# Patient Record
Sex: Male | Born: 1960 | Race: Black or African American | Hispanic: No | Marital: Single | Smoking: Current some day smoker
Health system: Southern US, Community
[De-identification: ages and names within clinical notes are randomized; demographics above are authoritative.]

## PROBLEM LIST (undated history)

## (undated) DIAGNOSIS — N401 Enlarged prostate with lower urinary tract symptoms: Secondary | ICD-10-CM

## (undated) DIAGNOSIS — I5020 Unspecified systolic (congestive) heart failure: Secondary | ICD-10-CM

## (undated) DIAGNOSIS — R600 Localized edema: Secondary | ICD-10-CM

## (undated) DIAGNOSIS — G4733 Obstructive sleep apnea (adult) (pediatric): Secondary | ICD-10-CM

## (undated) DIAGNOSIS — I10 Essential (primary) hypertension: Secondary | ICD-10-CM

## (undated) DIAGNOSIS — N183 Chronic kidney disease, stage 3 unspecified: Secondary | ICD-10-CM

## (undated) DIAGNOSIS — E119 Type 2 diabetes mellitus without complications: Secondary | ICD-10-CM

## (undated) DIAGNOSIS — Z972 Presence of dental prosthetic device (complete) (partial): Secondary | ICD-10-CM

## (undated) DIAGNOSIS — G4730 Sleep apnea, unspecified: Secondary | ICD-10-CM

## (undated) DIAGNOSIS — Z973 Presence of spectacles and contact lenses: Secondary | ICD-10-CM

## (undated) HISTORY — DX: Unspecified systolic (congestive) heart failure: I50.20

## (undated) HISTORY — PX: UMBILICAL HERNIA REPAIR: SHX196

## (undated) HISTORY — PX: HERNIA REPAIR: SHX51

## (undated) HISTORY — DX: Essential (primary) hypertension: I10

## (undated) HISTORY — DX: Morbid (severe) obesity due to excess calories: E66.01

## (undated) HISTORY — DX: Type 2 diabetes mellitus without complications: E11.9

---

## 2004-02-01 DIAGNOSIS — R600 Localized edema: Secondary | ICD-10-CM | POA: Insufficient documentation

## 2004-02-01 DIAGNOSIS — E669 Obesity, unspecified: Secondary | ICD-10-CM | POA: Insufficient documentation

## 2004-02-01 DIAGNOSIS — Z720 Tobacco use: Secondary | ICD-10-CM | POA: Insufficient documentation

## 2004-02-01 DIAGNOSIS — G4730 Sleep apnea, unspecified: Secondary | ICD-10-CM | POA: Insufficient documentation

## 2004-02-01 DIAGNOSIS — I10 Essential (primary) hypertension: Secondary | ICD-10-CM | POA: Insufficient documentation

## 2004-02-02 DIAGNOSIS — E789 Disorder of lipoprotein metabolism, unspecified: Secondary | ICD-10-CM | POA: Insufficient documentation

## 2004-02-02 DIAGNOSIS — R739 Hyperglycemia, unspecified: Secondary | ICD-10-CM | POA: Insufficient documentation

## 2005-02-20 DIAGNOSIS — R079 Chest pain, unspecified: Secondary | ICD-10-CM | POA: Insufficient documentation

## 2007-05-18 DIAGNOSIS — Z125 Encounter for screening for malignant neoplasm of prostate: Secondary | ICD-10-CM | POA: Insufficient documentation

## 2007-12-30 DIAGNOSIS — G5600 Carpal tunnel syndrome, unspecified upper limb: Secondary | ICD-10-CM | POA: Insufficient documentation

## 2008-10-03 DIAGNOSIS — Z91199 Patient's noncompliance with other medical treatment and regimen due to unspecified reason: Secondary | ICD-10-CM | POA: Insufficient documentation

## 2012-11-30 DIAGNOSIS — K429 Umbilical hernia without obstruction or gangrene: Secondary | ICD-10-CM | POA: Insufficient documentation

## 2012-12-22 DIAGNOSIS — R9431 Abnormal electrocardiogram [ECG] [EKG]: Secondary | ICD-10-CM | POA: Insufficient documentation

## 2019-06-09 ENCOUNTER — Encounter: Payer: Self-pay | Admitting: Emergency Medicine

## 2019-06-09 ENCOUNTER — Ambulatory Visit
Admission: EM | Admit: 2019-06-09 | Discharge: 2019-06-09 | Disposition: A | Discharge status: Home / Self Care | Payer: Self-pay | Attending: Physician Assistant | Admitting: Physician Assistant

## 2019-06-09 ENCOUNTER — Other Ambulatory Visit: Payer: Self-pay

## 2019-06-09 DIAGNOSIS — J3489 Other specified disorders of nose and nasal sinuses: Secondary | ICD-10-CM

## 2019-06-09 DIAGNOSIS — R05 Cough: Secondary | ICD-10-CM

## 2019-06-09 DIAGNOSIS — R059 Cough, unspecified: Secondary | ICD-10-CM

## 2019-06-09 MED ORDER — IPRATROPIUM BROMIDE 0.06 % NA SOLN: 2.0000 | Freq: Four times a day (QID) | NASAL | 0 refills | Status: DC

## 2019-06-09 MED ORDER — PREDNISONE 50 MG PO TABS: 50.0000 mg | ORAL_TABLET | Freq: Every day | ORAL | 0 refills | Status: DC

## 2019-06-09 NOTE — Discharge Instructions (Signed)
Prednisone for cough and sinus pressure. Start atrovent nasal spray for nasal congestion/drainage. You can use over the counter nasal saline rinse such as neti pot for nasal congestion. Keep hydrated, your urine should be clear to pale yellow in color. Tylenol/motrin for fever and pain. Continue to quarantine until symptoms improve. Monitor for any worsening of symptoms, chest pain, shortness of breath, wheezing, swelling of the throat, go to the emergency department for further evaluation needed.

## 2019-06-09 NOTE — ED Provider Notes (Signed)
[name]    CSN: [id number] Arrival date & time: 06/09/19  1906      History   Chief Complaint Chief Complaint  Patient presents with  . URI    HPI Jason Mccarty is a 59 y.o. male.   59 year old male comes in for 5 day of URI symptoms. Has had cough, chills, night sweats, headache, abdominal pain, rhinorrhea, nasal congestion, sore throat. No known fevers. Abdominal pain only with coughing. Denies pain without coughing. Denies nausea, vomiting. Had diarrhea that resolved. Denies shortness of breath. Changes in taste/smell. Negative COVID test 3 days ago. Have not been taking anything for the symptoms. Increasing fluid intake. Current every day smoker, 0.25ppd, 35+ year history.      History reviewed. No pertinent past medical history.  There are no problems to display for this patient.   Past Surgical History:  Procedure Laterality Date  . HERNIA REPAIR         Home Medications    Prior to Admission medications   Medication Sig Start Date End Date Taking? Authorizing Provider  ipratropium (ATROVENT) 0.06 % nasal spray Place 2 sprays into both nostrils 4 (four) times daily. 06/09/19   Ok Edwards, PA-C  predniSONE (DELTASONE) 50 MG tablet Take 1 tablet (50 mg total) by mouth daily with breakfast. 06/09/19   Ok Edwards, PA-C    Family History Family History  Problem Relation Age of Onset  . Heart failure Father   . Hypertension Father     Social History Social History   Tobacco Use  . Smoking status: Current Every Day Smoker    Packs/day: 0.30  . Smokeless tobacco: Never Used  Substance Use Topics  . Alcohol use: Yes    Comment: very rarely  . Drug use: Never     Allergies   Benadryl [diphenhydramine]   Review of Systems Review of Systems  Reason unable to perform ROS: See HPI as above.     Physical Exam Triage Vital Signs ED Triage Vitals  Enc Vitals Group     BP 06/09/19 1930 (!) 172/104     Pulse Rate 06/09/19 1930 73   Resp 06/09/19 1930 18     Temp 06/09/19 1930 98.8 F (37.1 C)     Temp Source 06/09/19 1930 Oral     SpO2 06/09/19 1930 95 %     Weight --      Height --      Head Circumference --      Peak Flow --      Pain Score 06/09/19 1928 0     Pain Loc --      Pain Edu? --      Excl. in [place]? --    No data found.  Updated Vital Signs BP (!) 172/104 (BP Location: Right Arm)   Pulse 73   Temp 98.8 F (37.1 C) (Oral)   Resp 18   SpO2 95%   Physical Exam Constitutional:      General: He is not in acute distress.    Appearance: Normal appearance. He is not ill-appearing, toxic-appearing or diaphoretic.  HENT:     Head: Normocephalic and atraumatic.     Right Ear: Tympanic membrane, ear canal and external ear normal. Tympanic membrane is not erythematous or bulging.     Left Ear: Tympanic membrane, ear canal and external ear normal. Tympanic membrane is not erythematous or bulging.     Nose: Congestion present.     Right Sinus:  Maxillary sinus tenderness and frontal sinus tenderness present.     Left Sinus: Maxillary sinus tenderness and frontal sinus tenderness present.     Mouth/Throat:     Mouth: Mucous membranes are moist.     Pharynx: Oropharynx is clear. Uvula midline.  Cardiovascular:     Rate and Rhythm: Normal rate and regular rhythm.     Heart sounds: Normal heart sounds. No murmur. No friction rub. No gallop.   Pulmonary:     Effort: Pulmonary effort is normal. No accessory muscle usage, prolonged expiration, respiratory distress or retractions.     Comments: Lungs clear to auscultation without adventitious lung sounds. Musculoskeletal:     Cervical back: Normal range of motion and neck supple.  Neurological:     General: No focal deficit present.     Mental Status: He is alert and oriented to person, place, and time.      UC Treatments / Results  Labs (all labs ordered are listed, but only abnormal results are displayed) Labs Reviewed - No data to  display  EKG   Radiology No results found.  Procedures Procedures (including critical care time)  Medications Ordered in UC Medications - No data to display  Initial Impression / Assessment and Plan / UC Course  I have reviewed the triage vital signs and the nursing notes.  Pertinent labs & imaging results that were available during my care of the patient were reviewed by me and considered in my medical decision making (see chart for details).    Patient with negative COVID testing 3 days ago. However, given significant symptoms, will have patient quarantine and monitor for now. Prednisone for sinus pressure/cough. atrovent for nasal congestion/drainage. Other symptomatic treatment discussed. Push fluids. Return precautions given. Patient expresses understanding and agrees to plan.  Final Clinical Impressions(s) / UC Diagnoses   Final diagnoses:  Sinus pressure  Cough   ED Prescriptions    Medication Sig Dispense Auth. Provider   predniSONE (DELTASONE) 50 MG tablet Take 1 tablet (50 mg total) by mouth daily with breakfast. 5 tablet Rollo Farquhar V, PA-C   ipratropium (ATROVENT) 0.06 % nasal spray Place 2 sprays into both nostrils 4 (four) times daily. 15 mL Ok Edwards, PA-C     PDMP not reviewed this encounter.   Ok Edwards, PA-C 06/09/19 2012

## 2019-06-09 NOTE — ED Triage Notes (Addendum)
Pt presents to [facility] for assessment of 5 days of waking up covered in sweat, slight headache, cough, belly pain, chills.  Took COVID test Sunday, negative.  Pt states felt this way before when he had the flu.

## 2020-01-28 ENCOUNTER — Ambulatory Visit
Admission: EM | Admit: 2020-01-28 | Discharge: 2020-01-28 | Disposition: A | Discharge status: Home / Self Care | Payer: Self-pay

## 2020-01-28 ENCOUNTER — Other Ambulatory Visit: Payer: Self-pay

## 2020-01-28 ENCOUNTER — Telehealth: Payer: Self-pay | Admitting: Physician Assistant

## 2020-01-28 DIAGNOSIS — Z20822 Contact with and (suspected) exposure to covid-19: Secondary | ICD-10-CM

## 2020-01-28 DIAGNOSIS — J209 Acute bronchitis, unspecified: Secondary | ICD-10-CM

## 2020-01-28 MED ORDER — PREDNISONE 50 MG PO TABS: 50.0000 mg | ORAL_TABLET | Freq: Every day | ORAL | 0 refills | Status: DC

## 2020-01-28 MED ORDER — DOXYCYCLINE HYCLATE 100 MG PO CAPS: 100.0000 mg | ORAL_CAPSULE | Freq: Two times a day (BID) | ORAL | 0 refills | Status: DC

## 2020-01-28 NOTE — Telephone Encounter (Signed)
Failed eprescribe due to system error. Written Rx provided.

## 2020-01-28 NOTE — ED Triage Notes (Signed)
Pt c/o dry cough with chest congestion x1wk.

## 2020-01-28 NOTE — ED Provider Notes (Signed)
[name]    CSN: [id number] Arrival date & time: 01/28/20  1037      History   Chief Complaint Chief Complaint  Patient presents with  . Cough    HPI Jason Mccarty is a 59 y.o. male.   59 year old male comes in for 1 week history of URI symptoms. Had rhinorrhea, nasal congestion that has since resolved. Now with persistent cough that is worsening. Has increased mucous production/chest congestion, but states having hard time coughing up. Denies fever, chills, body aches. Minimal shortness of breath, mostly after coughing. Denies DOE. Current every day smoker.     History reviewed. No pertinent past medical history.  There are no problems to display for this patient.   Past Surgical History:  Procedure Laterality Date  . HERNIA REPAIR         Home Medications    Prior to Admission medications   Medication Sig Start Date End Date Taking? Authorizing Provider  aspirin EC 81 MG tablet Take 81 mg by mouth. Once a week   Yes [provider]  doxycycline (VIBRAMYCIN) 100 MG capsule Take 1 capsule (100 mg total) by mouth 2 (two) times daily. 01/28/20   Tasia Catchings, Maris Abascal V, PA-C  predniSONE (DELTASONE) 50 MG tablet Take 1 tablet (50 mg total) by mouth daily with breakfast. 01/28/20   Ok Edwards, PA-C    Family History Family History  Problem Relation Age of Onset  . Heart failure Father   . Hypertension Father     Social History Social History   Tobacco Use  . Smoking status: Current Every Day Smoker    Packs/day: 0.30  . Smokeless tobacco: Never Used  Substance Use Topics  . Alcohol use: Not Currently    Comment: very rarely  . Drug use: Never     Allergies   Benadryl [diphenhydramine]   Review of Systems Review of Systems  Reason unable to perform ROS: See HPI as above.     Physical Exam Triage Vital Signs ED Triage Vitals  Enc Vitals Group     BP 01/28/20 1132 (!) 167/99     Pulse Rate 01/28/20 1132 (!) 57     Resp 01/28/20  1132 18     Temp 01/28/20 1132 98.4 F (36.9 C)     Temp Source 01/28/20 1132 Oral     SpO2 01/28/20 1132 96 %     Weight --      Height --      Head Circumference --      Peak Flow --      Pain Score 01/28/20 1133 0     Pain Loc --      Pain Edu? --      Excl. in [place]? --    No data found.  Updated Vital Signs BP (!) 167/99 (BP Location: Left Arm)   Pulse (!) 57   Temp 98.4 F (36.9 C) (Oral)   Resp 18   SpO2 96%   Physical Exam Constitutional:      General: He is not in acute distress.    Appearance: Normal appearance. He is not ill-appearing, toxic-appearing or diaphoretic.  HENT:     Head: Normocephalic and atraumatic.     Mouth/Throat:     Mouth: Mucous membranes are moist.     Pharynx: Oropharynx is clear. Uvula midline.  Cardiovascular:     Rate and Rhythm: Normal rate and regular rhythm.     Heart sounds: Normal heart sounds. No  murmur heard.  No friction rub. No gallop.   Pulmonary:     Effort: Pulmonary effort is normal. No accessory muscle usage, prolonged expiration, respiratory distress or retractions.     Comments: Intermittent rhonchi. No rales, wheezing. Good air movement. Musculoskeletal:     Cervical back: Normal range of motion and neck supple.  Skin:    General: Skin is warm and dry.  Neurological:     General: No focal deficit present.     Mental Status: He is alert and oriented to person, place, and time.      UC Treatments / Results  Labs (all labs ordered are listed, but only abnormal results are displayed) Labs Reviewed  NOVEL CORONAVIRUS, NAA    EKG   Radiology No results found.  Procedures Procedures (including critical care time)  Medications Ordered in UC Medications - No data to display  Initial Impression / Assessment and Plan / UC Course  I have reviewed the triage vital signs and the nursing notes.  Pertinent labs & imaging results that were available during my care of the patient were reviewed by me and  considered in my medical decision making (see chart for details).    COVID PCR test ordered. Patient to quarantine until testing results return. No alarming signs on exam. Patient without diagnosed COPD, however, given smoking history with current presentation, will cover with prednisone and doxycycline. Other symptomatic treatment discussed.  Push fluids.  Return precautions given.  Patient expresses understanding and agrees to plan.  Final Clinical Impressions(s) / UC Diagnoses   Final diagnoses:  Encounter for screening laboratory testing for COVID-19 virus  Acute bronchitis, unspecified organism    ED Prescriptions    Medication Sig Dispense Auth. Provider   predniSONE (DELTASONE) 50 MG tablet Take 1 tablet (50 mg total) by mouth daily with breakfast. 5 tablet Sekai Gitlin V, PA-C   doxycycline (VIBRAMYCIN) 100 MG capsule Take 1 capsule (100 mg total) by mouth 2 (two) times daily. 10 capsule Ok Edwards, PA-C     PDMP not reviewed this encounter.   Ok Edwards, PA-C 01/28/20 1220

## 2020-01-28 NOTE — Discharge Instructions (Signed)
COVID PCR testing ordered. I would like you to quarantine until testing results. Start prednisone and doxycycline as directed. Can take over the counter mucinex, allergy medicine for symptoms as well. Tylenol/motrin for pain and fever. Keep hydrated, urine should be clear to pale yellow in color. If experiencing shortness of breath, trouble breathing, go to the emergency department for further evaluation needed.

## 2020-01-29 LAB — SARS-COV-2, NAA 2 DAY TAT

## 2020-01-29 LAB — NOVEL CORONAVIRUS, NAA: SARS-CoV-2, NAA: DETECTED — AB

## 2020-01-30 ENCOUNTER — Telehealth: Payer: Self-pay | Admitting: Nurse Practitioner

## 2020-01-30 NOTE — Telephone Encounter (Signed)
Called to discuss with Melissa Montane about Covid symptoms and the use of casirivimab/imdevimab, a combination monoclonal antibody infusion for those with mild to moderate Covid symptoms and at a high risk of hospitalization.     Pt is qualified for this infusion at the [facility] infusion center due to co-morbid conditions (BMI >25).   Per ED notes patient may be >10day symptom onset however would need to discuss further.   Unable to reach. Message left.   Alda Lea, AGPCNP-BC

## 2020-01-30 NOTE — Telephone Encounter (Signed)
Connected with Jason Mccarty - nearly all symptoms have resolved since last ER visit. Sx started > 10 days ago now and not a candidate for monoclonal antibody therapy.   No questions for me - I wished him well.    Jason Madeira, MSN, NP-C [facility] [email or web address]

## 2020-12-08 ENCOUNTER — Encounter (HOSPITAL_COMMUNITY): Payer: Self-pay | Admitting: Emergency Medicine

## 2020-12-08 ENCOUNTER — Ambulatory Visit
Admission: EM | Admit: 2020-12-08 | Discharge: 2020-12-08 | Disposition: A | Discharge status: Home / Self Care | Payer: Self-pay

## 2020-12-08 ENCOUNTER — Other Ambulatory Visit: Payer: Self-pay

## 2020-12-08 ENCOUNTER — Emergency Department (HOSPITAL_COMMUNITY)
Admission: EM | Admit: 2020-12-08 | Discharge: 2020-12-08 | Disposition: A | Discharge status: Home / Self Care | Payer: Self-pay | Attending: Emergency Medicine | Admitting: Emergency Medicine

## 2020-12-08 DIAGNOSIS — M79604 Pain in right leg: Secondary | ICD-10-CM | POA: Insufficient documentation

## 2020-12-08 DIAGNOSIS — M79661 Pain in right lower leg: Secondary | ICD-10-CM

## 2020-12-08 DIAGNOSIS — R609 Edema, unspecified: Secondary | ICD-10-CM

## 2020-12-08 DIAGNOSIS — R2241 Localized swelling, mass and lump, right lower limb: Secondary | ICD-10-CM

## 2020-12-08 DIAGNOSIS — F17210 Nicotine dependence, cigarettes, uncomplicated: Secondary | ICD-10-CM | POA: Insufficient documentation

## 2020-12-08 DIAGNOSIS — I2510 Atherosclerotic heart disease of native coronary artery without angina pectoris: Secondary | ICD-10-CM | POA: Insufficient documentation

## 2020-12-08 LAB — CBC WITH DIFFERENTIAL/PLATELET
Abs Immature Granulocytes: 0.03 10*3/uL (ref 0.00–0.07)
Basophils Absolute: 0 10*3/uL (ref 0.0–0.1)
Basophils Relative: 1 %
Eosinophils Absolute: 0.3 10*3/uL (ref 0.0–0.5)
Eosinophils Relative: 3 %
HCT: 45.9 % (ref 39.0–52.0)
Hemoglobin: 15.5 g/dL (ref 13.0–17.0)
Immature Granulocytes: 0 %
Lymphocytes Relative: 31 %
Lymphs Abs: 2.6 10*3/uL (ref 0.7–4.0)
MCH: 30.6 pg (ref 26.0–34.0)
MCHC: 33.8 g/dL (ref 30.0–36.0)
MCV: 90.5 fL (ref 80.0–100.0)
Monocytes Absolute: 0.6 10*3/uL (ref 0.1–1.0)
Monocytes Relative: 7 %
Neutro Abs: 4.9 10*3/uL (ref 1.7–7.7)
Neutrophils Relative %: 58 %
Platelets: 209 10*3/uL (ref 150–400)
RBC: 5.07 MIL/uL (ref 4.22–5.81)
RDW: 12.4 % (ref 11.5–15.5)
WBC: 8.5 10*3/uL (ref 4.0–10.5)
nRBC: 0 % (ref 0.0–0.2)

## 2020-12-08 LAB — COMPREHENSIVE METABOLIC PANEL
ALT: 29 U/L (ref 0–44)
AST: 27 U/L (ref 15–41)
Albumin: 3.6 g/dL (ref 3.5–5.0)
Alkaline Phosphatase: 70 U/L (ref 38–126)
Anion gap: 9 (ref 5–15)
BUN: 16 mg/dL (ref 8–23)
CO2: 27 mmol/L (ref 22–32)
Calcium: 9 mg/dL (ref 8.9–10.3)
Chloride: 101 mmol/L (ref 98–111)
Creatinine, Ser: 1.46 mg/dL — ABNORMAL HIGH (ref 0.61–1.24)
GFR, Estimated: 54 mL/min — ABNORMAL LOW (ref >60)
Glucose, Bld: 132 mg/dL — ABNORMAL HIGH (ref 70–99)
Potassium: 3.7 mmol/L (ref 3.5–5.1)
Sodium: 137 mmol/L (ref 135–145)
Total Bilirubin: 0.6 mg/dL (ref 0.3–1.2)
Total Protein: 7.4 g/dL (ref 6.5–8.1)

## 2020-12-08 MED ORDER — APIXABAN (ELIQUIS) EDUCATION KIT FOR DVT/PE PATIENTS
PACK | Freq: Once | Status: DC
  Filled 2020-12-08: qty 1

## 2020-12-08 MED ORDER — APIXABAN 5 MG PO TABS
10.0000 mg | ORAL_TABLET | Freq: Once | ORAL | Status: AC
  Administered 2020-12-08: 10 mg via ORAL
  Filled 2020-12-08: qty 2

## 2020-12-08 NOTE — ED Triage Notes (Signed)
Pt c/o right foot and knee swelling onset x 1 week ago. States it is painful when he walks but otherwise not painful. Has tried a knee brace and vicks at home without relief.

## 2020-12-08 NOTE — ED Provider Notes (Signed)
[facility] EMERGENCY DEPARTMENT Provider Note   CSN: [id number] Arrival date & time: 12/08/20  1808     History Chief Complaint  Patient presents with   Leg Pain    Jason Mccarty is a 60 y.o. male.   Leg Pain Associated symptoms: no back pain and no fever    60 year old male with a past medical history of CAD presenting to the emergency department with atraumatic leg pain and swelling.  Patient states that he takes a baby aspirin.  He states that 4 to 5 days ago, he noticed pain behind his right knee.  Since that time, he has had progressively worsening right lower extremity swelling.  He denies any known trauma.  He denies any fever or redness in that extremity.  No history of similar symptoms.  Patient was seen in urgent care and then sent here for a DVT ultrasound.  Patient denies any history of PE or DVT.  He denies any chest pain or shortness of breath whatsoever.  History reviewed. No pertinent past medical history.  There are no problems to display for this patient.   Past Surgical History:  Procedure Laterality Date   HERNIA REPAIR         Family History  Problem Relation Age of Onset   Heart failure Father    Hypertension Father     Social History   Tobacco Use   Smoking status: Every Day    Packs/day: 0.30    Types: Cigarettes   Smokeless tobacco: Never  Substance Use Topics   Alcohol use: Not Currently    Comment: very rarely   Drug use: Never    Home Medications Prior to Admission medications   Medication Sig Start Date End Date Taking? Authorizing Provider  aspirin EC 81 MG tablet Take 81 mg by mouth. Once a week    [provider]  doxycycline (VIBRAMYCIN) 100 MG capsule Take 1 capsule (100 mg total) by mouth 2 (two) times daily. 01/28/20   Tasia Catchings, Amy V, PA-C  predniSONE (DELTASONE) 50 MG tablet Take 1 tablet (50 mg total) by mouth daily with breakfast. 01/28/20   Tasia Catchings, Amy V, PA-C    Allergies    Benadryl  [diphenhydramine]  Review of Systems   Review of Systems  Constitutional:  Negative for chills and fever.  HENT:  Negative for ear pain and sore throat.   Eyes:  Negative for pain and visual disturbance.  Respiratory:  Negative for cough and shortness of breath.   Cardiovascular:  Positive for leg swelling. Negative for chest pain and palpitations.  Gastrointestinal:  Negative for abdominal pain and vomiting.  Genitourinary:  Negative for dysuria and hematuria.  Musculoskeletal:  Negative for arthralgias and back pain.  Skin:  Negative for color change and rash.  Neurological:  Negative for seizures and syncope.  All other systems reviewed and are negative.  Physical Exam Updated Vital Signs BP (!) 190/95 (BP Location: Right Arm)   Pulse 68   Temp 98.3 F (36.8 C) (Oral)   Resp 16   Ht '5\' 11"'$  (1.803 m)   Wt 117.9 kg   SpO2 98%   BMI 36.26 kg/m   Physical Exam Vitals and nursing note reviewed.  Constitutional:      General: He is not in acute distress.    Appearance: He is well-developed. He is not ill-appearing or toxic-appearing.  HENT:     Head: Normocephalic and atraumatic.  Eyes:     Conjunctiva/sclera: Conjunctivae normal.  Cardiovascular:     Rate and Rhythm: Normal rate and regular rhythm.     Heart sounds: No murmur heard. Pulmonary:     Effort: Pulmonary effort is normal. No respiratory distress.     Breath sounds: Normal breath sounds.  Abdominal:     Palpations: Abdomen is soft.     Tenderness: There is no abdominal tenderness.  Musculoskeletal:     Cervical back: Neck supple.     Right lower leg: Edema present.     Left lower leg: Edema present.     Comments: Patient has trace pitting edema to the left lower extremity.  Patient has no right joint line tenderness, but does have subjective tenderness to palpation to the right popliteal fossa.  He also has tenderness to the right calf on calf squeeze.  He has 2+ pitting edema, greater on the right when  compared to the left.  2+ DP pulse bilaterally.  Skin:    General: Skin is warm and dry.  Neurological:     Mental Status: He is alert.    ED Results / Procedures / Treatments   Labs (all labs ordered are listed, but only abnormal results are displayed) Labs Reviewed  COMPREHENSIVE METABOLIC PANEL - Abnormal; Notable for the following components:      Result Value   Glucose, Bld 132 (*)    Creatinine, Ser 1.46 (*)    GFR, Estimated 54 (*)    All other components within normal limits  CBC WITH DIFFERENTIAL/PLATELET    EKG None  Radiology No results found.  Procedures Procedures   Medications Ordered in ED Medications  apixaban (ELIQUIS) tablet 10 mg (10 mg Oral Given 12/08/20 2121)    ED Course  I have reviewed the triage vital signs and the nursing notes.  Pertinent labs & imaging results that were available during my care of the patient were reviewed by me and considered in my medical decision making (see chart for details).    MDM Rules/Calculators/A&P                           60 year old male with above past medical history presenting to the emergency department with progressively worsening atraumatic right lower extremity pain and swelling.  Vital signs reviewed, mildly hypertensive but otherwise within acceptable limits.  Physical exam is notable for swelling in the right lower extremity greater compared to the left.  He also has calf tenderness on my exam.  Although he has no recent surgery, recent travel, history of PE or DVT, the pretest probability for a DVT is moderate.  Other differential includes cellulitis or ligamentous injury.  The patient denies any trauma, I do not believe that radiographs are indicated as I have no concern for fracture.  The patient has no erythema or generalized tenderness over the skin so I doubt a superficial skin infection.  Fortunately, cannot obtain an ultrasound in the emergency department at this time.  Will empirically provide a  loading dose of Eliquis now and order a DVT ultrasound to be completed here tomorrow morning.  I instructed the patient to return to [facility] at 8 AM to have the DVT study completed.  Patient is comfortable with this plan.  The patient has no chest pain, shortness of breath, tachycardia, or hypoxia, I have no concern for pulmonary embolism at this time.  Strict return precautions discussed.  Final Clinical Impression(s) / ED Diagnoses Final diagnoses:  Right leg pain  Rx / DC Orders ED Discharge Orders          Ordered    US Venous Img Lower Unilateral Right (DVT)        12/08/20 2056             Claud Kelp, MD 12/08/20 [id number]    Isla Pence, MD 12/08/20 2308

## 2020-12-08 NOTE — Discharge Instructions (Addendum)
Please go to the hospital as soon as you leave urgent care for further evaluation and management. 

## 2020-12-08 NOTE — ED Triage Notes (Signed)
Pt sent from urgent care for further evaluation of right leg pain and swelling.  Pt states very difficult to walk.

## 2020-12-08 NOTE — ED Provider Notes (Signed)
Emergency Medicine Provider Triage Evaluation Note  Jason Mccarty , a 60 y.o. male  was evaluated in triage.  Pt complains of hypertension and right leg swelling. He was seen at urgent care and sent here for consideration of DVT ultrasound.  He states that he has had pain in his right leg for about 1 week and it is swollen.  He denies any history of DVT.  He states that he has not recently had any blood work done..  Review of Systems  Positive: Right leg pain and swelling Negative: Fevers  Physical Exam  BP (!) 186/95 (BP Location: Left Arm)   Pulse 70   Temp 98.6 F (37 C)   Resp 16   Ht '5\' 11"'$  (1.803 m)   Wt 117.9 kg   SpO2 95%   BMI 36.26 kg/m  Gen:   Awake, no distress   Resp:  Normal effort  MSK:   Moves extremities without difficulty, right calf and leg is swollen when compared to left. Other:  Intact sensation to light touch to distal right leg.  Right leg is warm and well-perfused.  Medical Decision Making  Medically screening exam initiated at 7:18 PM.  Appropriate orders placed.  Hawley Luby was informed that the remainder of the evaluation will be completed by another provider, this initial triage assessment does not replace that evaluation, and the importance of remaining in the ED until their evaluation is complete.  Patient presents today for concern of DVT along with hypertension.  Unable to obtain DVT study here today as he was not taking by provider until after 7 PM. As he reports he has not had any recent labs obtained, combined with his hypertension will obtain basic labs as if he does have a DVT he will require these.  Note: Portions of this report may have been transcribed using voice recognition software. Every effort was made to ensure accuracy; however, inadvertent computerized transcription errors may be present    Ollen Gross 12/08/20 1920    Luna Fuse, MD 12/11/20 [phone]

## 2020-12-08 NOTE — ED Provider Notes (Signed)
[name]    CSN: [id number] Arrival date & time: 12/08/20  1655      History   Chief Complaint Chief Complaint  Patient presents with   right swollen foot and knee    HPI Jason Mccarty is a 60 y.o. male.   Patient presents with 1 week history of right knee pain that extends down to right foot.  Also having some swelling that is located from right knee to right foot. Calf pain is present.  Patient has numbness that is present in the right great toe.  Denies any known injury to the leg but states that he did have an injury in the past to his right knee where he fell.  Patient has been using a knee brace without relief of swelling or pain.  Denies any chest pain or shortness of breath. Pain is mainly present when patient is walking.   Patient has elevated blood pressure reading in urgent care today.  Has had a mild headache today but denies any dizziness, blurred vision, nausea, vomiting.  Patient does not currently take any prescription medications for blood pressure.    History reviewed. No pertinent past medical history.  There are no problems to display for this patient.   Past Surgical History:  Procedure Laterality Date   HERNIA REPAIR         Home Medications    Prior to Admission medications   Medication Sig Start Date End Date Taking? Authorizing Provider  aspirin EC 81 MG tablet Take 81 mg by mouth. Once a week    [provider]  doxycycline (VIBRAMYCIN) 100 MG capsule Take 1 capsule (100 mg total) by mouth 2 (two) times daily. 01/28/20   Tasia Catchings, Amy V, PA-C  predniSONE (DELTASONE) 50 MG tablet Take 1 tablet (50 mg total) by mouth daily with breakfast. 01/28/20   Ok Edwards, PA-C    Family History Family History  Problem Relation Age of Onset   Heart failure Father    Hypertension Father     Social History Social History   Tobacco Use   Smoking status: Every Day    Packs/day: 0.30    Types: Cigarettes   Smokeless tobacco: Never   Substance Use Topics   Alcohol use: Not Currently    Comment: very rarely   Drug use: Never     Allergies   Benadryl [diphenhydramine]   Review of Systems Review of Systems Per HPI  Physical Exam Triage Vital Signs ED Triage Vitals  Enc Vitals Group     BP 12/08/20 1718 (!) 202/95     Pulse Rate 12/08/20 1718 71     Resp 12/08/20 1718 18     Temp 12/08/20 1718 97.6 F (36.4 C)     Temp Source 12/08/20 1718 Oral     SpO2 12/08/20 1718 95 %     Weight --      Height --      Head Circumference --      Peak Flow --      Pain Score 12/08/20 1720 0     Pain Loc --      Pain Edu? --      Excl. in [place]? --    No data found.  Updated Vital Signs BP (!) 197/97 (BP Location: Left Arm)   Pulse 71   Temp 97.6 F (36.4 C) (Oral)   Resp 18   SpO2 95%   Visual Acuity Right Eye Distance:   Left Eye Distance:  Bilateral Distance:    Right Eye Near:   Left Eye Near:    Bilateral Near:     Physical Exam Constitutional:      Appearance: Normal appearance.  HENT:     Head: Normocephalic and atraumatic.  Eyes:     Extraocular Movements: Extraocular movements intact.     Conjunctiva/sclera: Conjunctivae normal.  Cardiovascular:     Rate and Rhythm: Normal rate and regular rhythm.     Pulses: Normal pulses.     Heart sounds: Normal heart sounds.  Pulmonary:     Effort: Pulmonary effort is normal. No respiratory distress.     Breath sounds: Normal breath sounds.  Musculoskeletal:     Right knee: Swelling present. No erythema. Tenderness present. Normal pulse.     Left knee: Normal.     Right lower leg: Tenderness present. No bony tenderness. 2+ Edema present.     Right ankle: Swelling present. Tenderness present. Normal pulse.     Right foot: Normal capillary refill. Swelling and tenderness present. Normal pulse.     Comments: Tenderness to palpation left of patella of right knee.  Tenderness to palpation to calf.  Diffuse 2+ pitting edema present to right lower  extremity.  Neurovascular intact.  Neurological:     General: No focal deficit present.     Mental Status: He is alert and oriented to person, place, and time. Mental status is at baseline.  Psychiatric:        Mood and Affect: Mood normal.        Behavior: Behavior normal.        Thought Content: Thought content normal.        Judgment: Judgment normal.     UC Treatments / Results  Labs (all labs ordered are listed, but only abnormal results are displayed) Labs Reviewed - No data to display  EKG   Radiology No results found.  Procedures Procedures (including critical care time)  Medications Ordered in UC Medications - No data to display  Initial Impression / Assessment and Plan / UC Course  I have reviewed the triage vital signs and the nursing notes.  Pertinent labs & imaging results that were available during my care of the patient were reviewed by me and considered in my medical decision making (see chart for details).     Advised patient that he would need to go to the hospital to rule out DVT of right lower leg.  Patient also having elevated blood pressure which requires further treatment at the hospital.  Patient was agreeable with plan and voiced understanding. Patient was stable at discharge. Agree with wife taking patient to the hospital.  Final Clinical Impressions(s) / UC Diagnoses   Final diagnoses:  Localized swelling of right lower leg  Right calf pain     Discharge Instructions      Please go to the hospital as soon as you leave urgent care for further evaluation and management.      ED Prescriptions   None    PDMP not reviewed this encounter.   Odis Luster, FNP 12/08/20 [phone]

## 2020-12-08 NOTE — Discharge Instructions (Addendum)
It is possible that you have a blood clot in your right leg.  We have given you 1 dose of anticoagulation that should cover you until tomorrow.  Please obtain the right lower extremity ultrasound tomorrow.  Please go to Admissions here at the main campus tomorrow at 8 AM for your ultrasound study.  If it is positive, they will tell you to come back to the emergency department to be put on longer term anticoagulation.  If it is negative, please follow-up with your primary care doctor by calling the phone number I have provided for you to make an appointment.

## 2020-12-09 ENCOUNTER — Ambulatory Visit (HOSPITAL_COMMUNITY)
Admission: RE | Admit: 2020-12-09 | Discharge: 2020-12-09 | Disposition: A | Discharge status: Home / Self Care | Payer: Self-pay | Source: Ambulatory Visit | Attending: Emergency Medicine | Admitting: Emergency Medicine

## 2020-12-09 DIAGNOSIS — R609 Edema, unspecified: Secondary | ICD-10-CM | POA: Insufficient documentation

## 2020-12-09 NOTE — Progress Notes (Signed)
VASCULAR LAB    Right lower extremity venous duplex has been performed.  See CV proc for preliminary results.   Valree Feild, RVT 12/09/2020, 9:12 AM

## 2021-04-15 DIAGNOSIS — I5020 Unspecified systolic (congestive) heart failure: Secondary | ICD-10-CM

## 2021-04-15 DIAGNOSIS — I255 Ischemic cardiomyopathy: Secondary | ICD-10-CM

## 2021-04-15 DIAGNOSIS — E119 Type 2 diabetes mellitus without complications: Secondary | ICD-10-CM

## 2021-04-15 HISTORY — DX: Type 2 diabetes mellitus without complications: E11.9

## 2021-04-15 HISTORY — DX: Ischemic cardiomyopathy: I25.5

## 2021-04-15 HISTORY — DX: Unspecified systolic (congestive) heart failure: I50.20

## 2021-05-11 ENCOUNTER — Observation Stay (HOSPITAL_COMMUNITY): Payer: 59

## 2021-05-11 ENCOUNTER — Encounter (HOSPITAL_COMMUNITY): Payer: Self-pay

## 2021-05-11 ENCOUNTER — Other Ambulatory Visit: Payer: Self-pay

## 2021-05-11 ENCOUNTER — Emergency Department (HOSPITAL_COMMUNITY): Payer: 59

## 2021-05-11 ENCOUNTER — Inpatient Hospital Stay (HOSPITAL_COMMUNITY)
Admission: EM | Admit: 2021-05-11 | Discharge: 2021-05-13 | DRG: 304 | Disposition: A | Discharge status: Home / Self Care | Payer: 59 | Attending: Internal Medicine | Admitting: Internal Medicine

## 2021-05-11 DIAGNOSIS — E1129 Type 2 diabetes mellitus with other diabetic kidney complication: Secondary | ICD-10-CM | POA: Diagnosis present

## 2021-05-11 DIAGNOSIS — N1832 Chronic kidney disease, stage 3b: Secondary | ICD-10-CM | POA: Diagnosis present

## 2021-05-11 DIAGNOSIS — I160 Hypertensive urgency: Principal | ICD-10-CM | POA: Diagnosis present

## 2021-05-11 DIAGNOSIS — I130 Hypertensive heart and chronic kidney disease with heart failure and stage 1 through stage 4 chronic kidney disease, or unspecified chronic kidney disease: Secondary | ICD-10-CM | POA: Diagnosis present

## 2021-05-11 DIAGNOSIS — J9602 Acute respiratory failure with hypercapnia: Secondary | ICD-10-CM | POA: Diagnosis present

## 2021-05-11 DIAGNOSIS — R0603 Acute respiratory distress: Secondary | ICD-10-CM

## 2021-05-11 DIAGNOSIS — R0602 Shortness of breath: Secondary | ICD-10-CM

## 2021-05-11 DIAGNOSIS — Z888 Allergy status to other drugs, medicaments and biological substances status: Secondary | ICD-10-CM

## 2021-05-11 DIAGNOSIS — I5022 Chronic systolic (congestive) heart failure: Secondary | ICD-10-CM

## 2021-05-11 DIAGNOSIS — Z20822 Contact with and (suspected) exposure to covid-19: Secondary | ICD-10-CM | POA: Diagnosis present

## 2021-05-11 DIAGNOSIS — F17210 Nicotine dependence, cigarettes, uncomplicated: Secondary | ICD-10-CM | POA: Diagnosis present

## 2021-05-11 DIAGNOSIS — N179 Acute kidney failure, unspecified: Secondary | ICD-10-CM | POA: Diagnosis present

## 2021-05-11 DIAGNOSIS — N183 Chronic kidney disease, stage 3 unspecified: Secondary | ICD-10-CM | POA: Diagnosis present

## 2021-05-11 DIAGNOSIS — E1122 Type 2 diabetes mellitus with diabetic chronic kidney disease: Secondary | ICD-10-CM | POA: Diagnosis present

## 2021-05-11 DIAGNOSIS — Z8249 Family history of ischemic heart disease and other diseases of the circulatory system: Secondary | ICD-10-CM

## 2021-05-11 DIAGNOSIS — G4733 Obstructive sleep apnea (adult) (pediatric): Secondary | ICD-10-CM | POA: Diagnosis present

## 2021-05-11 DIAGNOSIS — I509 Heart failure, unspecified: Secondary | ICD-10-CM

## 2021-05-11 DIAGNOSIS — N1831 Chronic kidney disease, stage 3a: Secondary | ICD-10-CM

## 2021-05-11 DIAGNOSIS — I10 Essential (primary) hypertension: Secondary | ICD-10-CM | POA: Diagnosis present

## 2021-05-11 DIAGNOSIS — R7401 Elevation of levels of liver transaminase levels: Secondary | ICD-10-CM | POA: Diagnosis present

## 2021-05-11 DIAGNOSIS — I5021 Acute systolic (congestive) heart failure: Secondary | ICD-10-CM | POA: Diagnosis present

## 2021-05-11 DIAGNOSIS — K7581 Nonalcoholic steatohepatitis (NASH): Secondary | ICD-10-CM | POA: Diagnosis present

## 2021-05-11 DIAGNOSIS — Z8709 Personal history of other diseases of the respiratory system: Secondary | ICD-10-CM

## 2021-05-11 DIAGNOSIS — J9601 Acute respiratory failure with hypoxia: Secondary | ICD-10-CM | POA: Diagnosis present

## 2021-05-11 DIAGNOSIS — Z6841 Body Mass Index (BMI) 40.0 and over, adult: Secondary | ICD-10-CM

## 2021-05-11 DIAGNOSIS — J810 Acute pulmonary edema: Secondary | ICD-10-CM

## 2021-05-11 HISTORY — DX: Personal history of other diseases of the respiratory system: Z87.09

## 2021-05-11 HISTORY — DX: Acute respiratory failure with hypercapnia: J96.02

## 2021-05-11 HISTORY — DX: Sleep apnea, unspecified: G47.30

## 2021-05-11 LAB — I-STAT VENOUS BLOOD GAS, ED
Acid-Base Excess: 1 mmol/L (ref 0.0–2.0)
Bicarbonate: 31.1 mmol/L — ABNORMAL HIGH (ref 20.0–28.0)
Calcium, Ion: 1.14 mmol/L — ABNORMAL LOW (ref 1.15–1.40)
HCT: 44 % (ref 39.0–52.0)
Hemoglobin: 15 g/dL (ref 13.0–17.0)
O2 Saturation: 93 %
Potassium: 4.4 mmol/L (ref 3.5–5.1)
Sodium: 140 mmol/L (ref 135–145)
TCO2: 33 mmol/L — ABNORMAL HIGH (ref 22–32)
pCO2, Ven: 71.8 mmHg (ref 44.0–60.0)
pH, Ven: 7.245 — ABNORMAL LOW (ref 7.250–7.430)
pO2, Ven: 79 mmHg — ABNORMAL HIGH (ref 32.0–45.0)

## 2021-05-11 LAB — COMPREHENSIVE METABOLIC PANEL
ALT: 87 U/L — ABNORMAL HIGH (ref 0–44)
ALT: 86 U/L — ABNORMAL HIGH (ref 0–44)
AST: 120 U/L — ABNORMAL HIGH (ref 15–41)
AST: 75 U/L — ABNORMAL HIGH (ref 15–41)
Albumin: 3.2 g/dL — ABNORMAL LOW (ref 3.5–5.0)
Albumin: 3.4 g/dL — ABNORMAL LOW (ref 3.5–5.0)
Alkaline Phosphatase: 83 U/L (ref 38–126)
Alkaline Phosphatase: 76 U/L (ref 38–126)
Anion gap: 10 (ref 5–15)
Anion gap: 8 (ref 5–15)
BUN: 18 mg/dL (ref 8–23)
BUN: 18 mg/dL (ref 8–23)
CO2: 26 mmol/L (ref 22–32)
CO2: 28 mmol/L (ref 22–32)
Calcium: 8.5 mg/dL — ABNORMAL LOW (ref 8.9–10.3)
Calcium: 8.7 mg/dL — ABNORMAL LOW (ref 8.9–10.3)
Chloride: 101 mmol/L (ref 98–111)
Chloride: 103 mmol/L (ref 98–111)
Creatinine, Ser: 1.8 mg/dL — ABNORMAL HIGH (ref 0.61–1.24)
Creatinine, Ser: 1.8 mg/dL — ABNORMAL HIGH (ref 0.61–1.24)
GFR, Estimated: 42 mL/min — ABNORMAL LOW (ref >60)
GFR, Estimated: 42 mL/min — ABNORMAL LOW (ref >60)
Glucose, Bld: 229 mg/dL — ABNORMAL HIGH (ref 70–99)
Glucose, Bld: 188 mg/dL — ABNORMAL HIGH (ref 70–99)
Potassium: 4.2 mmol/L (ref 3.5–5.1)
Potassium: 3.9 mmol/L (ref 3.5–5.1)
Sodium: 137 mmol/L (ref 135–145)
Sodium: 139 mmol/L (ref 135–145)
Total Bilirubin: 1.2 mg/dL (ref 0.3–1.2)
Total Bilirubin: 0.7 mg/dL (ref 0.3–1.2)
Total Protein: 6.7 g/dL (ref 6.5–8.1)
Total Protein: 7.1 g/dL (ref 6.5–8.1)

## 2021-05-11 LAB — ECHOCARDIOGRAM COMPLETE
AR max vel: 3.24 cm2
AV Area VTI: 3.58 cm2
AV Area mean vel: 3.18 cm2
AV Mean grad: 2 mmHg
AV Peak grad: 4.8 mmHg
Ao pk vel: 1.1 m/s
Area-P 1/2: 3.58 cm2
Calc EF: 42.1 %
Height: 71 in
S' Lateral: 4.5 cm
Single Plane A2C EF: 40.7 %
Single Plane A4C EF: 40.4 %
Weight: 4560 oz

## 2021-05-11 LAB — CBC WITH DIFFERENTIAL/PLATELET
Abs Immature Granulocytes: 0.07 10*3/uL (ref 0.00–0.07)
Basophils Absolute: 0 10*3/uL (ref 0.0–0.1)
Basophils Relative: 0 %
Eosinophils Absolute: 0.2 10*3/uL (ref 0.0–0.5)
Eosinophils Relative: 2 %
HCT: 46 % (ref 39.0–52.0)
Hemoglobin: 14.7 g/dL (ref 13.0–17.0)
Immature Granulocytes: 1 %
Lymphocytes Relative: 15 %
Lymphs Abs: 1.4 10*3/uL (ref 0.7–4.0)
MCH: 29.6 pg (ref 26.0–34.0)
MCHC: 32 g/dL (ref 30.0–36.0)
MCV: 92.7 fL (ref 80.0–100.0)
Monocytes Absolute: 0.5 10*3/uL (ref 0.1–1.0)
Monocytes Relative: 6 %
Neutro Abs: 6.9 10*3/uL (ref 1.7–7.7)
Neutrophils Relative %: 76 %
Platelets: 209 10*3/uL (ref 150–400)
RBC: 4.96 MIL/uL (ref 4.22–5.81)
RDW: 12.9 % (ref 11.5–15.5)
WBC: 9 10*3/uL (ref 4.0–10.5)
nRBC: 0 % (ref 0.0–0.2)

## 2021-05-11 LAB — CBG MONITORING, ED
Glucose-Capillary: 238 mg/dL — ABNORMAL HIGH (ref 70–99)
Glucose-Capillary: 148 mg/dL — ABNORMAL HIGH (ref 70–99)

## 2021-05-11 LAB — URINALYSIS, ROUTINE W REFLEX MICROSCOPIC
Bacteria, UA: NONE SEEN
Bilirubin Urine: NEGATIVE
Glucose, UA: NEGATIVE mg/dL
Hgb urine dipstick: NEGATIVE
Ketones, ur: NEGATIVE mg/dL
Nitrite: NEGATIVE
Protein, ur: 30 mg/dL — AB
Specific Gravity, Urine: 1.015 (ref 1.005–1.030)
pH: 5 (ref 5.0–8.0)

## 2021-05-11 LAB — BRAIN NATRIURETIC PEPTIDE: B Natriuretic Peptide: 249.8 pg/mL — ABNORMAL HIGH (ref 0.0–100.0)

## 2021-05-11 LAB — TROPONIN I (HIGH SENSITIVITY)
Troponin I (High Sensitivity): 36 ng/L — ABNORMAL HIGH (ref <18)
Troponin I (High Sensitivity): 55 ng/L — ABNORMAL HIGH (ref <18)
Troponin I (High Sensitivity): 77 ng/L — ABNORMAL HIGH (ref <18)
Troponin I (High Sensitivity): 101 ng/L (ref <18)

## 2021-05-11 LAB — HEMOGLOBIN A1C
Hgb A1c MFr Bld: 7.3 % — ABNORMAL HIGH (ref 4.8–5.6)
Mean Plasma Glucose: 162.81 mg/dL

## 2021-05-11 LAB — PROTEIN / CREATININE RATIO, URINE
Creatinine, Urine: 178.2 mg/dL
Protein Creatinine Ratio: 0.17 mg/mg{Cre} — ABNORMAL HIGH (ref 0.00–0.15)
Total Protein, Urine: 30 mg/dL

## 2021-05-11 LAB — RESP PANEL BY RT-PCR (FLU A&B, COVID) ARPGX2
Influenza A by PCR: NEGATIVE
Influenza B by PCR: NEGATIVE
SARS Coronavirus 2 by RT PCR: NEGATIVE

## 2021-05-11 LAB — HIV ANTIBODY (ROUTINE TESTING W REFLEX): HIV Screen 4th Generation wRfx: NONREACTIVE

## 2021-05-11 LAB — LIPID PANEL
Cholesterol: 144 mg/dL (ref 0–200)
HDL: 34 mg/dL — ABNORMAL LOW (ref >40)
LDL Cholesterol: 98 mg/dL (ref 0–99)
Total CHOL/HDL Ratio: 4.2 RATIO
Triglycerides: 60 mg/dL (ref <150)
VLDL: 12 mg/dL (ref 0–40)

## 2021-05-11 LAB — GLUCOSE, CAPILLARY: Glucose-Capillary: 183 mg/dL — ABNORMAL HIGH (ref 70–99)

## 2021-05-11 MED ORDER — AMLODIPINE BESYLATE 10 MG PO TABS
10.0000 mg | ORAL_TABLET | Freq: Every day | ORAL | Status: DC
  Administered 2021-05-12 – 2021-05-13 (×2): 10 mg via ORAL
  Filled 2021-05-11 (×2): qty 1

## 2021-05-11 MED ORDER — ACETAMINOPHEN 650 MG RE SUPP: 650.0000 mg | Freq: Four times a day (QID) | RECTAL | Status: DC | PRN

## 2021-05-11 MED ORDER — IRBESARTAN 300 MG PO TABS
300.0000 mg | ORAL_TABLET | Freq: Every day | ORAL | Status: DC
  Administered 2021-05-11: 300 mg via ORAL
  Filled 2021-05-11 (×2): qty 1

## 2021-05-11 MED ORDER — FUROSEMIDE 10 MG/ML IJ SOLN
60.0000 mg | Freq: Once | INTRAMUSCULAR | Status: AC
  Administered 2021-05-11: 60 mg via INTRAVENOUS
  Filled 2021-05-11: qty 6

## 2021-05-11 MED ORDER — ACETAMINOPHEN 325 MG PO TABS: 650.0000 mg | ORAL_TABLET | Freq: Four times a day (QID) | ORAL | Status: DC | PRN

## 2021-05-11 MED ORDER — NITROGLYCERIN IN D5W 200-5 MCG/ML-% IV SOLN
0.0000 ug/min | INTRAVENOUS | Status: DC
  Administered 2021-05-11: 40 ug/min via INTRAVENOUS
  Filled 2021-05-11: qty 250

## 2021-05-11 MED ORDER — INSULIN ASPART 100 UNIT/ML IJ SOLN
0.0000 [IU] | Freq: Three times a day (TID) | INTRAMUSCULAR | Status: DC
  Administered 2021-05-11: 2 [IU] via SUBCUTANEOUS
  Administered 2021-05-11: 5 [IU] via SUBCUTANEOUS
  Administered 2021-05-12: 3 [IU] via SUBCUTANEOUS
  Administered 2021-05-12: 2 [IU] via SUBCUTANEOUS
  Administered 2021-05-12: 5 [IU] via SUBCUTANEOUS
  Administered 2021-05-13: 3 [IU] via SUBCUTANEOUS

## 2021-05-11 MED ORDER — RIVAROXABAN 10 MG PO TABS
10.0000 mg | ORAL_TABLET | Freq: Every day | ORAL | Status: DC
  Administered 2021-05-11 – 2021-05-13 (×3): 10 mg via ORAL
  Filled 2021-05-11 (×3): qty 1

## 2021-05-11 NOTE — H&P (Addendum)
Date: 05/11/2021               Patient Name:  Jason Mccarty MRN: [id number]  DOB: 12-23-1960 Age / Sex: 61 y.o., male   PCP: Pcp, No         Medical Service: Internal Medicine Teaching Service         Attending Physician: Dr. Lucious Groves, DO    First Contact: Wayland Denis, MD Pager: EZ [phone]  Second Contact: Hadassah Pais, MD Pager: PB [phone]       After Hours (After 5p/  First Contact Pager: [phone]  weekends / holidays): Second Contact Pager: [phone]   SUBJECTIVE   Chief Complaint: Shortness of breath  History of Present Illness: Jason Mccarty is a 61 year old man not currently under any medical care who presents with shortness of breath.  Patient woke up from sleep with short of breath.  He was not having any shortness of breath and in his normal state of health before going to bed.  EMS noted patient's SPO2 in the 70s, patient with increased work of breathing and diaphoretic, and his SBP greater than 300.  He was treated with nitroglycerin.  Patient reports a history of OSA and HTN but stopped taking medications after losing weight.  He has regained weight but never reestablished with primary care doctor.  He denies any chest pain or history of similar episodes.   Review of Systems  Constitutional:  Negative for chills and fever.  HENT:  Negative for congestion and sinus pain.   Eyes:  Negative for blurred vision and double vision.  Respiratory:  Positive for shortness of breath. Negative for cough, sputum production and wheezing.   Cardiovascular:  Negative for chest pain, orthopnea and leg swelling.  Gastrointestinal:  Negative for abdominal pain, constipation and diarrhea.  Genitourinary:  Negative for frequency and urgency.  Musculoskeletal:  Negative for falls and myalgias.  Skin:  Negative for itching and rash.  Neurological:  Positive for dizziness (The last week a few episodes). Negative for weakness.  Endo/Heme/Allergies:  Negative for polydipsia.   Psychiatric/Behavioral:  Negative for depression and substance abuse.     Meds:  Current Meds  Medication Sig   aspirin EC 81 MG tablet Take 81 mg by mouth. Once a week   ibuprofen (ADVIL) 600 MG tablet Take 600 mg by mouth every 6 (six) hours as needed for headache or mild pain.    Past Medical History:  Diagnosis Date   Sleep apnea     Past Surgical History:  Procedure Laterality Date   HERNIA REPAIR      Social:  Lives With: His girlfriend who is his support emergency contact Occupation: Security guard Level of Function: Performs own ADLs and IADLs PCP: No PCP Substances: Has smoked greater than 40 years, average pack per day.  He has cut back to a pack of cigarettes per week recently.  Occasionally drinks alcohol. No current drug use, some when he was a young man.  Family History:  Family History  Problem Relation Age of Onset   Heart failure Father    Hypertension Father      Allergies: Allergies as of 05/11/2021 - Review Complete 05/11/2021  Allergen Reaction Noted   Benadryl [diphenhydramine] Itching 06/09/2019    OBJECTIVE:   Physical Exam: Blood pressure (!) 153/99, pulse 70, temperature 98 F (36.7 C), temperature source Axillary, resp. rate 17, height 5\' 11"  (1.803 m), weight 129.3 kg, SpO2 100 %.  Constitutional: Wearing CPAP on approach, taken  off and put on 2 L nasal cannula.  He is in no acute distress.  He is nondiaphoretic and talking in complete sentences.  Obese. HENT: normocephalic atraumatic, mucous membranes moist Eyes: conjunctiva non-erythematous, arcus senilis bilateral from 9 to 3 oclock position Neck: supple, obese cannot appreciate JVD Cardiovascular: regular rate and rhythm, no murmur, 1+ pitting edema Pulmonary/Chest: On supplemental oxygen, no wheezing, no rhonchi, rales at bases  Abdominal: soft, non-tender, non-distended MSK: normal bulk and tone Neurological: alert & oriented x 3, 5/5 strength in bilateral upper and lower  extremities Skin: warm and dry Psych: Normal mood no behavior  Labs: CBC    Component Value Date/Time   WBC 9.0 05/11/2021 0341   RBC 4.96 05/11/2021 0341   HGB 15.0 05/11/2021 0505   HCT 44.0 05/11/2021 0505   PLT 209 05/11/2021 0341   MCV 92.7 05/11/2021 0341   MCH 29.6 05/11/2021 0341   MCHC 32.0 05/11/2021 0341   RDW 12.9 05/11/2021 0341   LYMPHSABS 1.4 05/11/2021 0341   MONOABS 0.5 05/11/2021 0341   EOSABS 0.2 05/11/2021 0341   BASOSABS 0.0 05/11/2021 0341     CMP     Component Value Date/Time   NA 140 05/11/2021 0505   K 4.4 05/11/2021 0505   CL 101 05/11/2021 0341   CO2 26 05/11/2021 0341   GLUCOSE 229 (H) 05/11/2021 0341   BUN 18 05/11/2021 0341   CREATININE 1.80 (H) 05/11/2021 0341   CALCIUM 8.5 (L) 05/11/2021 0341   PROT 6.7 05/11/2021 0341   ALBUMIN 3.2 (L) 05/11/2021 0341   AST 120 (H) 05/11/2021 0341   ALT 87 (H) 05/11/2021 0341   ALKPHOS 83 05/11/2021 0341   BILITOT 1.2 05/11/2021 0341   GFRNONAA 42 (L) 05/11/2021 0341    Imaging: DG Chest Port 1 View  Result Date: 05/11/2021 CLINICAL DATA:  61 year old male with history of sudden onset of shortness of breath. EXAM: PORTABLE CHEST 1 VIEW COMPARISON:  No priors. FINDINGS: Ill-defined opacities and areas of interstitial prominence throughout the mid to lower lungs bilaterally. More dense opacity in the medial aspect of the left lung base, favored to reflect subsegmental atelectasis. Small left pleural effusion. No pneumothorax. No evidence of pulmonary edema. Heart size is normal. The patient is rotated to the left on today's exam, resulting in distortion of the mediastinal contours and reduced diagnostic sensitivity and specificity for mediastinal pathology. IMPRESSION: 1. Ill-defined opacities and areas of interstitial prominence in the mid to lower lungs bilaterally, which could be seen in the setting of acute viral infection. 2. Small left-sided pleural effusion with probable subsegmental atelectasis in  the left lower lobe, although focal area of airspace consolidation is not excluded. 1. Electronically Signed   By: Vinnie Langton M.D.   On: 05/11/2021 05:01    EKG: personally reviewed my interpretation is sinus rhythm.  Prior P wave in II and III , possible LA enlargement. LAD.    ASSESSMENT & PLAN:    Assessment & Plan by Problem: Principal Problem:   Severe hypertension   Josemaria Brining is a 61 y.o. with pertinent PMH of HTN not on current treatment who presented with shortness of breath and and severe hypertension admitted for severe asymptomatic hypertension.   #Severe hypertension Patient not currently on antihypertensives.  He was once treated but after losing weight came off of medication.  He has regained weight and not followed by PCP.  Systolic blood pressure was initially reported to be 300, greater than 200 in ED but  he improved with nitro x2.  Independent review of chest x-ray showed bilateral opacities in mid and lower lung fields which I expect represents flash pulmonary edema in setting of severe hypertension.  Do not have a baseline creatinine established to see if patient has acute kidney injury.  He has some elevation of AST and ALT.  He is alert and oriented on exam.  -Gave one dose of Irbesartan. Given possible AKI, will stop Arb.Start amlodipine tomorrow and continue to diurese. Patient can be established in our clinic for further management at discharge. Given longstanding hypertension and lower extremity edema will order echo.  Patient responded to IV Lasix 60 mg , will give another dose today.  - Echo -IV Lasix 60 mg - Start amlodipine tomorrow  #AKI or CKD Stage 3b? - Only Cr on record was 12/08/20, Cr 1.46.   - Check urine protein to creatinine  -UA - Renal Ultrasound  #Hepatocellular Injury BP AST and ALT show liver enzymes are trending down, expect this could represent some minor ischemia from  episode of acute hypoxic respiratory failure. - Will need CMP  at hospital follow up. If not improving will need further workup.   #Obesity Family history of hypertension and heart failure.  We will check patient's lipid panel and hemoglobin A1c   Diet: Normal VTE: Enoxaparin IVF: None,None Code: Full  Prior to Admission Living Arrangement: Home, living with GF Anticipated Discharge Location: Home Barriers to Discharge: Workup as described under problem #1  Dispo: Admit patient to Observation with expected length of stay less than 2 midnights.  Signed: Drema Pry, MD Internal Medicine Resident PGY-3 Pager: [phone]  05/11/2021, 9:15 AM

## 2021-05-11 NOTE — ED Notes (Signed)
Called lab to have lipid panel and A1C added to previous collection

## 2021-05-11 NOTE — ED Notes (Addendum)
Pt tolerating well. O2 decreased to 2LPM  at this time with O2 at 100% on the monitor. Pt denies sob. No acute distress. RT made aware of O2 requirement change.

## 2021-05-11 NOTE — ED Provider Notes (Signed)
[facility] EMERGENCY DEPARTMENT Provider Note   CSN: [id number] Arrival date & time: 05/11/21  5809     History  Chief Complaint  Patient presents with   Shortness of Breath    Jason Mccarty is a 61 y.o. male.  The history is provided by the patient, the EMS personnel and medical records.  Shortness of Breath Jason Mccarty Age is a 61 y.o. male who presents to the Emergency Department complaining of sob.  He presents to the ED for evaluation of abrupt onset of SOB that woke him from sleep.  He was in his routine state of health yesterday.  EMS reports respiratory distress on fire arrival with SpO2 70s, increased work of breathing, diaphoresis and SBP greater than 300.  EMS treated with 2 SL nitroglycerin, CPAP.  He has a hx/o OSA.  He has a remote hx/o HTN but not on current meds - did not need meds after losing weight.  No alcohol or drug use.  On ED arrival pt reports significant improvement in sxs.      Home Medications Prior to Admission medications   Medication Sig Start Date End Date Taking? Authorizing Provider  aspirin EC 81 MG tablet Take 81 mg by mouth. Once a week   Yes [provider]  ibuprofen (ADVIL) 600 MG tablet Take 600 mg by mouth every 6 (six) hours as needed for headache or mild pain.   Yes [provider]  doxycycline (VIBRAMYCIN) 100 MG capsule Take 1 capsule (100 mg total) by mouth 2 (two) times daily. Patient not taking: Reported on 05/11/2021 01/28/20   Ok Edwards, PA-C  predniSONE (DELTASONE) 50 MG tablet Take 1 tablet (50 mg total) by mouth daily with breakfast. Patient not taking: Reported on 05/11/2021 01/28/20   Ok Edwards, PA-C      Allergies    Benadryl [diphenhydramine]    Review of Systems   Review of Systems  Respiratory:  Positive for shortness of breath.   All other systems reviewed and are negative.  Physical Exam Updated Vital Signs BP (!) 167/98    Pulse 66    Temp 98 F (36.7 C) (Axillary)    Resp 16     Ht 5\' 11"  (1.803 m)    Wt 129.3 kg    SpO2 100%    BMI 39.75 kg/m  Physical Exam Vitals and nursing note reviewed.  Constitutional:      General: He is in acute distress.     Appearance: He is well-developed. He is diaphoretic.  HENT:     Head: Normocephalic and atraumatic.  Cardiovascular:     Rate and Rhythm: Normal rate and regular rhythm.     Heart sounds: No murmur heard. Pulmonary:     Effort: Pulmonary effort is normal. No respiratory distress.     Comments: Occasional rhonchi bilaterally Abdominal:     Palpations: Abdomen is soft.     Tenderness: There is no abdominal tenderness. There is no guarding or rebound.  Musculoskeletal:        General: No tenderness.     Comments: 1+ pitting edema to BLE  Skin:    General: Skin is warm.  Neurological:     Mental Status: He is alert and oriented to person, place, and time.  Psychiatric:        Behavior: Behavior normal.    ED Results / Procedures / Treatments   Labs (all labs ordered are listed, but only abnormal results are displayed) Labs Reviewed  COMPREHENSIVE METABOLIC PANEL - Abnormal; Notable for the following components:      Result Value   Glucose, Bld 229 (*)    Creatinine, Ser 1.80 (*)    Calcium 8.5 (*)    Albumin 3.2 (*)    AST 120 (*)    ALT 87 (*)    GFR, Estimated 42 (*)    All other components within normal limits  BRAIN NATRIURETIC PEPTIDE - Abnormal; Notable for the following components:   B Natriuretic Peptide 249.8 (*)    All other components within normal limits  I-STAT VENOUS BLOOD GAS, ED - Abnormal; Notable for the following components:   pH, Ven 7.245 (*)    pCO2, Ven 71.8 (*)    pO2, Ven 79.0 (*)    Bicarbonate 31.1 (*)    TCO2 33 (*)    Calcium, Ion 1.14 (*)    All other components within normal limits  TROPONIN I (HIGH SENSITIVITY) - Abnormal; Notable for the following components:   Troponin I (High Sensitivity) 36 (*)    All other components within normal limits  TROPONIN I (HIGH  SENSITIVITY) - Abnormal; Notable for the following components:   Troponin I (High Sensitivity) 55 (*)    All other components within normal limits  RESP PANEL BY RT-PCR (FLU A&B, COVID) ARPGX2  CBC WITH DIFFERENTIAL/PLATELET    EKG EKG Interpretation  Date/Time:  Friday May 11 2021 03:52:48 EST Ventricular Rate:  85 PR Interval:  195 QRS Duration: 106 QT Interval:  422 QTC Calculation: 502 R Axis:   -54 Text Interpretation: Sinus rhythm Left axis deviation Low voltage, extremity leads Consider anterior infarct Prolonged QT interval Confirmed by Quintella Reichert [phone]) on 05/11/2021 4:20:13 AM  Radiology DG Chest Port 1 View  Result Date: 05/11/2021 CLINICAL DATA:  61 year old male with history of sudden onset of shortness of breath. EXAM: PORTABLE CHEST 1 VIEW COMPARISON:  No priors. FINDINGS: Ill-defined opacities and areas of interstitial prominence throughout the mid to lower lungs bilaterally. More dense opacity in the medial aspect of the left lung base, favored to reflect subsegmental atelectasis. Small left pleural effusion. No pneumothorax. No evidence of pulmonary edema. Heart size is normal. The patient is rotated to the left on today's exam, resulting in distortion of the mediastinal contours and reduced diagnostic sensitivity and specificity for mediastinal pathology. IMPRESSION: 1. Ill-defined opacities and areas of interstitial prominence in the mid to lower lungs bilaterally, which could be seen in the setting of acute viral infection. 2. Small left-sided pleural effusion with probable subsegmental atelectasis in the left lower lobe, although focal area of airspace consolidation is not excluded. 1. Electronically Signed   By: Vinnie Langton M.D.   On: 05/11/2021 05:01    Procedures Procedures   CRITICAL CARE Performed by: Quintella Reichert   Total critical care time: 40 minutes  Critical care time was exclusive of separately billable procedures and treating other  patients.  Critical care was necessary to treat or prevent imminent or life-threatening deterioration.  Critical care was time spent personally by me on the following activities: development of treatment plan with patient and/or surrogate as well as nursing, discussions with consultants, evaluation of patient's response to treatment, examination of patient, obtaining history from patient or surrogate, ordering and performing treatments and interventions, ordering and review of laboratory studies, ordering and review of radiographic studies, pulse oximetry and re-evaluation of patient's condition.  Medications Ordered in ED Medications  furosemide (LASIX) injection 60 mg (60 mg Intravenous Given 05/11/21  [place])    ED Course/ Medical Decision Making/ A&P                           Medical Decision Making Amount and/or Complexity of Data Reviewed Labs: ordered. Radiology: ordered.  Risk Prescription drug management. Decision regarding hospitalization.   Patient with remote hypertension history not currently on medications due to per patient not needing them secondary to weight loss here for evaluation of acute onset shortness of breath.  Patient markedly hypertensive for EMS prior to ED arrival and he was started on CPAP.  He was improving at time of ED presentation but continued to require BiPAP for respiratory support.  He was significantly hypertensive in the emergency department but compared to prehospital numbers did not require acute intervention.  Chest x-ray with bibasilar infiltrates, personally reviewed-do not favor acute respiratory infection at this time and feel his symptoms are likely more secondary to flash pulmonary edema.  BMP with slight increase in creatinine when compared to priors.  Troponins are mildly elevated and rising.  BNP is also mildly elevated.  He was treated with Lasix for diuresis.  On reassessment patient continues to feel improved and continues to appear improved  on exam.  Discussed with patient recommendation for admission for ongoing treatment and he is in agreement with treatment plan.  Medicine consult for admission for ongoing treatment.       Final Clinical Impression(s) / ED Diagnoses Final diagnoses:  Flash pulmonary edema ([place])  Respiratory distress    Rx / DC Orders ED Discharge Orders     None         Quintella Reichert, MD 05/11/21 [phone]

## 2021-05-11 NOTE — ED Notes (Signed)
Dr.Steen at bedside. Switched pt to 4LPM [place] at this time. Pt tolerating well. Will continue to monitor.

## 2021-05-11 NOTE — Progress Notes (Signed)
°  Echocardiogram 2D Echocardiogram has been performed.  Jason Mccarty 05/11/2021, 3:46 PM

## 2021-05-11 NOTE — Progress Notes (Signed)
Pt placed on CPAP for bed. RT will cont to monitor.

## 2021-05-11 NOTE — ED Notes (Signed)
Pt states he does not take blood thinners at home. Dr.Steen messaged at this time to clarify Xarelto order. Waiting for response.

## 2021-05-11 NOTE — ED Notes (Signed)
Breakfast tray ordered 

## 2021-05-11 NOTE — ED Notes (Signed)
Provider at bedside

## 2021-05-11 NOTE — ED Triage Notes (Signed)
Patient arrives via EMS from home with complaints of a sudden onset of shortness of breath. Patient woke up and had difficulty breathing. Patients oxygen saturations were in the 70's on room air at home. Initially, he was placed on a non-rebreather mask and then placed on bipap en route. Given 2 Nitro as well.    BP was 206/150 with ems. Patient reports no chest pain. Alert and oriented x4.   Hx of sleep apnea (wears cpap at night).

## 2021-05-11 NOTE — ED Notes (Signed)
Pt is 98% on RA. O2 turned off at this time. Pt educated and instructed by RN to call staff if he becomes short of breath without oxygen. Pt is currently eating breakfast and is tolerating RA with 98% on the monitor. Will continue to monitor.

## 2021-05-11 NOTE — Progress Notes (Signed)
Patient arrived to 4E from ED. CHG completed. Vitals stable. Tele placed and CCMD notified. Patient oriented to unit. Call bell within reach. Menu provided to patient.  Jason Mccarty

## 2021-05-12 DIAGNOSIS — G4733 Obstructive sleep apnea (adult) (pediatric): Secondary | ICD-10-CM | POA: Diagnosis present

## 2021-05-12 DIAGNOSIS — Z6841 Body Mass Index (BMI) 40.0 and over, adult: Secondary | ICD-10-CM | POA: Diagnosis not present

## 2021-05-12 DIAGNOSIS — Z8249 Family history of ischemic heart disease and other diseases of the circulatory system: Secondary | ICD-10-CM | POA: Diagnosis not present

## 2021-05-12 DIAGNOSIS — E1122 Type 2 diabetes mellitus with diabetic chronic kidney disease: Secondary | ICD-10-CM | POA: Diagnosis present

## 2021-05-12 DIAGNOSIS — R0603 Acute respiratory distress: Secondary | ICD-10-CM | POA: Diagnosis present

## 2021-05-12 DIAGNOSIS — J9601 Acute respiratory failure with hypoxia: Secondary | ICD-10-CM | POA: Diagnosis present

## 2021-05-12 DIAGNOSIS — Z888 Allergy status to other drugs, medicaments and biological substances status: Secondary | ICD-10-CM | POA: Diagnosis not present

## 2021-05-12 DIAGNOSIS — I160 Hypertensive urgency: Secondary | ICD-10-CM | POA: Diagnosis present

## 2021-05-12 DIAGNOSIS — N179 Acute kidney failure, unspecified: Secondary | ICD-10-CM | POA: Diagnosis present

## 2021-05-12 DIAGNOSIS — N1832 Chronic kidney disease, stage 3b: Secondary | ICD-10-CM | POA: Diagnosis present

## 2021-05-12 DIAGNOSIS — K7581 Nonalcoholic steatohepatitis (NASH): Secondary | ICD-10-CM | POA: Diagnosis present

## 2021-05-12 DIAGNOSIS — I5021 Acute systolic (congestive) heart failure: Secondary | ICD-10-CM | POA: Diagnosis present

## 2021-05-12 DIAGNOSIS — R7401 Elevation of levels of liver transaminase levels: Secondary | ICD-10-CM | POA: Diagnosis present

## 2021-05-12 DIAGNOSIS — Z20822 Contact with and (suspected) exposure to covid-19: Secondary | ICD-10-CM | POA: Diagnosis present

## 2021-05-12 DIAGNOSIS — F17210 Nicotine dependence, cigarettes, uncomplicated: Secondary | ICD-10-CM | POA: Diagnosis present

## 2021-05-12 DIAGNOSIS — J9602 Acute respiratory failure with hypercapnia: Secondary | ICD-10-CM | POA: Diagnosis present

## 2021-05-12 DIAGNOSIS — I5020 Unspecified systolic (congestive) heart failure: Secondary | ICD-10-CM

## 2021-05-12 DIAGNOSIS — I10 Essential (primary) hypertension: Secondary | ICD-10-CM

## 2021-05-12 DIAGNOSIS — I130 Hypertensive heart and chronic kidney disease with heart failure and stage 1 through stage 4 chronic kidney disease, or unspecified chronic kidney disease: Secondary | ICD-10-CM | POA: Diagnosis present

## 2021-05-12 DIAGNOSIS — N189 Chronic kidney disease, unspecified: Secondary | ICD-10-CM

## 2021-05-12 LAB — BASIC METABOLIC PANEL
Anion gap: 10 (ref 5–15)
BUN: 22 mg/dL (ref 8–23)
CO2: 28 mmol/L (ref 22–32)
Calcium: 8.5 mg/dL — ABNORMAL LOW (ref 8.9–10.3)
Chloride: 101 mmol/L (ref 98–111)
Creatinine, Ser: 2.03 mg/dL — ABNORMAL HIGH (ref 0.61–1.24)
GFR, Estimated: 37 mL/min — ABNORMAL LOW (ref >60)
Glucose, Bld: 175 mg/dL — ABNORMAL HIGH (ref 70–99)
Potassium: 3.5 mmol/L (ref 3.5–5.1)
Sodium: 139 mmol/L (ref 135–145)

## 2021-05-12 LAB — MAGNESIUM: Magnesium: 1.6 mg/dL — ABNORMAL LOW (ref 1.7–2.4)

## 2021-05-12 LAB — GLUCOSE, CAPILLARY
Glucose-Capillary: 162 mg/dL — ABNORMAL HIGH (ref 70–99)
Glucose-Capillary: 143 mg/dL — ABNORMAL HIGH (ref 70–99)
Glucose-Capillary: 218 mg/dL — ABNORMAL HIGH (ref 70–99)
Glucose-Capillary: 191 mg/dL — ABNORMAL HIGH (ref 70–99)

## 2021-05-12 MED ORDER — CARVEDILOL 3.125 MG PO TABS
3.1250 mg | ORAL_TABLET | Freq: Two times a day (BID) | ORAL | Status: DC
  Administered 2021-05-12 – 2021-05-13 (×2): 3.125 mg via ORAL
  Filled 2021-05-12 (×2): qty 1

## 2021-05-12 MED ORDER — POTASSIUM CHLORIDE CRYS ER 20 MEQ PO TBCR
40.0000 meq | EXTENDED_RELEASE_TABLET | Freq: Once | ORAL | Status: AC
  Administered 2021-05-12: 40 meq via ORAL
  Filled 2021-05-12: qty 2

## 2021-05-12 MED ORDER — FUROSEMIDE 20 MG PO TABS
20.0000 mg | ORAL_TABLET | Freq: Every day | ORAL | Status: DC
  Administered 2021-05-13: 20 mg via ORAL
  Filled 2021-05-12: qty 1

## 2021-05-12 MED ORDER — BLOOD PRESSURE CONTROL BOOK
Freq: Once | Status: AC
  Filled 2021-05-12: qty 1

## 2021-05-12 MED ORDER — MAGNESIUM SULFATE 2 GM/50ML IV SOLN
2.0000 g | Freq: Once | INTRAVENOUS | Status: AC
  Administered 2021-05-12: 2 g via INTRAVENOUS
  Filled 2021-05-12: qty 50

## 2021-05-12 MED ORDER — LIVING WELL WITH DIABETES BOOK
Freq: Once | Status: AC
  Filled 2021-05-12: qty 1

## 2021-05-12 MED ORDER — FUROSEMIDE 10 MG/ML IJ SOLN
60.0000 mg | Freq: Once | INTRAMUSCULAR | Status: AC
  Administered 2021-05-12: 60 mg via INTRAVENOUS
  Filled 2021-05-12: qty 6

## 2021-05-12 MED ORDER — LIVING BETTER WITH HEART FAILURE BOOK: Freq: Once | Status: AC

## 2021-05-12 NOTE — Progress Notes (Signed)
Pt already on CPAP for bed. RT will cont to monitor.

## 2021-05-12 NOTE — Consult Note (Addendum)
Cardiology Consultation:   Patient ID: Jason Mccarty MRN: [id number]; DOB: May 12, 1960  Admit date: 05/11/2021 Date of Consult: 05/12/2021  PCP:  Jason Mccarty No   [facility] Providers Cardiologist:  new - Dr. Marlou Mccarty    Patient Profile:   Jason Mccarty is a 61 y.o. male with a hx of hypertension, obesity, obstructive sleep apnea and newly diagnosed DM2 who is being seen 05/12/2021 for the evaluation of abnormal echocardiogram at the request of Dr. Heber Mccarty.  History of Present Illness:   Jason Mccarty is a 60 year old male with past medical history of hypertension, obesity, OSA on CPAP and newly diagnosed DM2.  According to the patient, several years ago, he was able to lose 70 pounds with diet and was able to come off of lisinopril.  However he later gained all the weight back.  He has not restarted on the blood pressure medication since.  He was seen in the ED in August 2022 for right leg pain, systolic blood pressure at the time was 197. Left lower extremity venous Doppler obtained in August 2022 was negative.  In the past year, he has been having intermittent lower extremity edema.  He was not on any diuretic at home.  He has been compliant with CPAP machine every night.  Patient was in his usual state of health until he woke up in the morning of 05/11/2021.  He woke up from sleep and he took off his CPAP.  He went to the bathroom and on his way back he started having worsening dyspnea.  This prompted the patient to seek medical attention at [facility], ED.  On arrival, blood pressure was reportedly to be in the 200 range.  His creatinine was 1.8, previous baseline 1.46 in August 2022.  Serial troponin 36 -->55 -->77 -->101. Liver enzyme was elevated with AST 120, ALT 87.  BMP 249.8.  Hemoglobin normal.  Viral panel negative.  Chest x-ray showed ill-defined opacities in the interstitial prominence in the mid to lower lungs bilaterally.  Patient was admitted to the hospital for flash pulmonary edema in the  setting of uncontrolled hypertension.  He was given 2 doses of 60 mg IV Lasix on 1/27 and 1 additional dose of 60 mg IV Lasix in the morning of 1/28.  So far, patient put out 4 L of fluid.  He was also placed on amlodipine for blood pressure control.  Echocardiogram obtained on 05/11/2021 showed EF 40 to 45% with regional wall motion abnormality, severe LVH, trivial MR.  At this point, his shortness of breath has completely resolved.  Cardiology service is consulted for abnormal echocardiogram.   Past Medical History:  Diagnosis Date   Sleep apnea     Past Surgical History:  Procedure Laterality Date   HERNIA REPAIR       Home Medications:  Prior to Admission medications   Medication Sig Start Date End Date Taking? Authorizing Provider  aspirin EC 81 MG tablet Take 81 mg by mouth. Once a week   Yes [provider]  ibuprofen (ADVIL) 600 MG tablet Take 600 mg by mouth every 6 (six) hours as needed for headache or mild pain.   Yes [provider]  doxycycline (VIBRAMYCIN) 100 MG capsule Take 1 capsule (100 mg total) by mouth 2 (two) times daily. Patient not taking: Reported on 05/11/2021 01/28/20   Ok Edwards, PA-C  predniSONE (DELTASONE) 50 MG tablet Take 1 tablet (50 mg total) by mouth daily with breakfast. Patient not taking: Reported on 05/11/2021  01/28/20   Ok Edwards, PA-C    Inpatient Medications: Scheduled Meds:  amLODipine  10 mg Oral Daily   insulin aspart  0-15 Units Subcutaneous TID WC   rivaroxaban  10 mg Oral Daily   Continuous Infusions:  PRN Meds: acetaminophen **OR** acetaminophen  Allergies:    Allergies  Allergen Reactions   Benadryl [Diphenhydramine] Itching    Social History:   Social History   Socioeconomic History   Marital status: Single    Spouse name: Not on file   Number of children: Not on file   Years of education: Not on file   Highest education level: Not on file  Occupational History   Not on file  Tobacco Use   Smoking  status: Every Day    Packs/day: 0.30    Types: Cigarettes   Smokeless tobacco: Never  Substance and Sexual Activity   Alcohol use: Not Currently    Comment: very rarely   Drug use: Never   Sexual activity: Not on file  Other Topics Concern   Not on file  Social History Narrative   Not on file   Social Determinants of Health   Financial Resource Strain: Not on file  Food Insecurity: Not on file  Transportation Needs: Not on file  Physical Activity: Not on file  Stress: Not on file  Social Connections: Not on file  Intimate Partner Violence: Not on file    Family History:    Family History  Problem Relation Age of Onset   Heart failure Father    Hypertension Father      ROS:  Please see the history of present illness.   All other ROS reviewed and negative.     Physical Exam/Data:   Vitals:   05/11/21 2203 05/12/21 0000 05/12/21 0430 05/12/21 0813  BP:  (!) 161/87 (!) 169/96 (!) 148/93  Pulse: 65 68 64 68  Resp: 18 16 20 16   Temp:  (!) 97.5 F (36.4 C) 98 F (36.7 C) 98.1 F (36.7 C)  TempSrc:  Axillary Oral Oral  SpO2: 96% 94% 98% 97%  Weight:   (!) 136.5 kg (!) 138.6 kg  Height:        Intake/Output Summary (Last 24 hours) at 05/12/2021 1107 Last data filed at 05/12/2021 1057 Gross per 24 hour  Intake 480 ml  Output 2150 ml  Net -1670 ml   Last 3 Weights 05/12/2021 05/12/2021 05/11/2021  Weight (lbs) 305 lb 8.9 oz 300 lb 14.4 oz 306 lb 10.6 oz  Weight (kg) 138.6 kg 136.487 kg 139.1 kg     Body mass index is 49.32 kg/m.  General:  Well nourished, well developed, in no acute distress HEENT: normal Neck: no JVD Vascular: No carotid bruits; Distal pulses 2+ bilaterally Cardiac:  normal S1, S2; RRR; no murmur  Lungs:  clear to auscultation bilaterally, no wheezing, rhonchi or rales  Abd: soft, nontender, no hepatomegaly  Ext: no edema Musculoskeletal:  No deformities, BUE and BLE strength normal and equal Skin: warm and dry  Neuro:  CNs 2-12 intact,  no focal abnormalities noted Psych:  Normal affect   EKG:  The EKG was personally reviewed and demonstrates: Normal sinus rhythm, no significant ST-T wave changes. Telemetry:  Telemetry was personally reviewed and demonstrates: Normal sinus rhythm, single 4 beat run of nonsustained VT, no recurrence.  Relevant CV Studies:  Echo 05/11/2021  1. Left ventricular ejection fraction, by estimation, is 40 to 45%. The  left ventricle has mildly decreased function.  The left ventricle  demonstrates regional wall motion abnormalities (see scoring  diagram/findings for description). There is severe  concentric left ventricular hypertrophy. Left ventricular diastolic  parameters are indeterminate. Elevated left ventricular end-diastolic  pressure.   2. Right ventricular systolic function was not well visualized. The right  ventricular size is normal. Tricuspid regurgitation signal is inadequate  for assessing PA pressure.   3. The mitral valve is normal in structure. Trivial mitral valve  regurgitation. No evidence of mitral stenosis.   4. The aortic valve is normal in structure. Aortic valve regurgitation is  not visualized. No aortic stenosis is present.   5. There is borderline dilatation of the ascending aorta, measuring 37  mm.   6. The inferior vena cava is normal in size with greater than 50%  respiratory variability, suggesting right atrial pressure of 3 mmHg.   Laboratory Data:  High Sensitivity Troponin:   Recent Labs  Lab 2021-05-31 0341 05/31/2021 0620 05/31/21 0953 05-31-21 1213  TROPONINIHS 36* 55* 77* 101*     Chemistry Recent Labs  Lab 31-May-2021 0341 05/31/21 0505 05-31-2021 0953 05/12/21 0258  NA 137 140 139 139  K 4.2 4.4 3.9 3.5  CL 101  --  103 101  CO2 26  --  28 28  GLUCOSE 229*  --  188* 175*  BUN 18  --  18 22  CREATININE 1.80*  --  1.80* 2.03*  CALCIUM 8.5*  --  8.7* 8.5*  MG  --   --   --  1.6*  GFRNONAA 42*  --  42* 37*  ANIONGAP 10  --  8 10    Recent  Labs  Lab 2021-05-31 0341 May 31, 2021 0953  PROT 6.7 7.1  ALBUMIN 3.2* 3.4*  AST 120* 75*  ALT 87* 86*  ALKPHOS 83 76  BILITOT 1.2 0.7   Lipids  Recent Labs  Lab 05-31-2021 0620  CHOL 144  TRIG 60  HDL 34*  LDLCALC 98  CHOLHDL 4.2    Hematology Recent Labs  Lab 2021/05/31 0341 2021/05/31 0505  WBC 9.0  --   RBC 4.96  --   HGB 14.7 15.0  HCT 46.0 44.0  MCV 92.7  --   MCH 29.6  --   MCHC 32.0  --   RDW 12.9  --   PLT 209  --    Thyroid No results for input(s): TSH, FREET4 in the last 168 hours.  BNP Recent Labs  Lab 31-May-2021 0341  BNP 249.8*    DDimer No results for input(s): DDIMER in the last 168 hours.   Radiology/Studies:  US RENAL  Result Date: 31-May-2021 CLINICAL DATA:  Acute renal insufficiency EXAM: RENAL / URINARY TRACT ULTRASOUND COMPLETE COMPARISON:  None. FINDINGS: Right Kidney: Renal measurements: 10.8 x 6.2 x 6.7 cm = volume: 216 mL. The lower pole is not well visualized due to overlying bowel gas. Echogenicity within normal limits. No mass or hydronephrosis visualized. Left Kidney: Renal measurements: 10.9 x 6.0 x 6.0 cm = volume: 108 mL. Echogenicity within normal limits. No mass or hydronephrosis visualized. Bladder: Appears normal for degree of bladder distention although only mild distention limits evaluation. Other: None. IMPRESSION: Normal appearance of the bilateral kidneys. Electronically Signed   By: Yvonne Kendall M.D.   On: 05/31/2021 14:45   DG Chest Port 1 View  Result Date: 05/31/21 CLINICAL DATA:  61 year old male with history of sudden onset of shortness of breath. EXAM: PORTABLE CHEST 1 VIEW COMPARISON:  No priors. FINDINGS: Ill-defined opacities  and areas of interstitial prominence throughout the mid to lower lungs bilaterally. More dense opacity in the medial aspect of the left lung base, favored to reflect subsegmental atelectasis. Small left pleural effusion. No pneumothorax. No evidence of pulmonary edema. Heart size is normal. The patient  is rotated to the left on today's exam, resulting in distortion of the mediastinal contours and reduced diagnostic sensitivity and specificity for mediastinal pathology. IMPRESSION: 1. Ill-defined opacities and areas of interstitial prominence in the mid to lower lungs bilaterally, which could be seen in the setting of acute viral infection. 2. Small left-sided pleural effusion with probable subsegmental atelectasis in the left lower lobe, although focal area of airspace consolidation is not excluded. 1. Electronically Signed   By: Vinnie Langton M.D.   On: 05/11/2021 05:01   ECHOCARDIOGRAM COMPLETE  Result Date: 05/11/2021    ECHOCARDIOGRAM REPORT   Patient Name:   SALBADOR FIVEASH Date of Exam: 05/11/2021 Medical Rec #:  [id number]    Height:       71.0 in Accession #:    [id number]   Weight:       285.0 lb Date of Birth:  1960/10/27     BSA:          2.451 m Patient Age:    61 years     BP:           162/83 mmHg Patient Gender: M            HR:           75 bpm. Exam Location:  Inpatient Procedure: 2D Echo, Cardiac Doppler and Color Doppler Indications:    CHF  History:        Patient has no prior history of Echocardiogram examinations.                 Signs/Symptoms:Shortness of Breath; Risk Factors:Sleep Apnea.  Sonographer:    Clayton Lefort RDCS (AE) Referring Phys: [id number] [address]ves  Sonographer Comments: Patient is morbidly obese. Image acquisition challenging due to patient body habitus. IMPRESSIONS  1. Left ventricular ejection fraction, by estimation, is 40 to 45%. The left ventricle has mildly decreased function. The left ventricle demonstrates regional wall motion abnormalities (see scoring diagram/findings for description). There is severe concentric left ventricular hypertrophy. Left ventricular diastolic parameters are indeterminate. Elevated left ventricular end-diastolic pressure.  2. Right ventricular systolic function was not well visualized. The right ventricular size is normal. Tricuspid  regurgitation signal is inadequate for assessing PA pressure.  3. The mitral valve is normal in structure. Trivial mitral valve regurgitation. No evidence of mitral stenosis.  4. The aortic valve is normal in structure. Aortic valve regurgitation is not visualized. No aortic stenosis is present.  5. There is borderline dilatation of the ascending aorta, measuring 37 mm.  6. The inferior vena cava is normal in size with greater than 50% respiratory variability, suggesting right atrial pressure of 3 mmHg. FINDINGS  Left Ventricle: Left ventricular ejection fraction, by estimation, is 40 to 45%. The left ventricle has mildly decreased function. The left ventricle demonstrates regional wall motion abnormalities. The left ventricular internal cavity size was normal in size. There is severe concentric left ventricular hypertrophy. Left ventricular diastolic parameters are indeterminate. Elevated left ventricular end-diastolic pressure. Right Ventricle: The right ventricular size is normal. No increase in right ventricular wall thickness. Right ventricular systolic function was not well visualized. Tricuspid regurgitation signal is inadequate for assessing PA pressure. Left Atrium: Left atrial size was not  well visualized. Right Atrium: Right atrial size was not well visualized. Pericardium: There is no evidence of pericardial effusion. Mitral Valve: The mitral valve is normal in structure. Trivial mitral valve regurgitation. No evidence of mitral valve stenosis. Tricuspid Valve: The tricuspid valve is normal in structure. Tricuspid valve regurgitation is not demonstrated. No evidence of tricuspid stenosis. Aortic Valve: The aortic valve is normal in structure. Aortic valve regurgitation is not visualized. No aortic stenosis is present. Aortic valve mean gradient measures 2.0 mmHg. Aortic valve peak gradient measures 4.8 mmHg. Aortic valve area, by VTI measures 3.58 cm. Pulmonic Valve: The pulmonic valve was normal in  structure. Pulmonic valve regurgitation is not visualized. No evidence of pulmonic stenosis. Aorta: The aortic root is normal in size and structure. There is borderline dilatation of the ascending aorta, measuring 37 mm. Venous: The inferior vena cava is normal in size with greater than 50% respiratory variability, suggesting right atrial pressure of 3 mmHg. IAS/Shunts: The interatrial septum appears to be lipomatous. No atrial level shunt detected by color flow Doppler.  LEFT VENTRICLE PLAX 2D LVIDd:         5.50 cm      Diastology LVIDs:         4.50 cm      LV e' medial:    4.74 cm/s LV PW:         2.10 cm      LV E/e' medial:  15.5 LV IVS:        2.00 cm      LV e' lateral:   4.47 cm/s LVOT diam:     2.10 cm      LV E/e' lateral: 16.4 LV SV:         66 LV SV Index:   27 LVOT Area:     3.46 cm  LV Volumes (MOD) LV vol d, MOD A2C: 172.0 ml LV vol d, MOD A4C: 171.0 ml LV vol s, MOD A2C: 102.0 ml LV vol s, MOD A4C: 102.0 ml LV SV MOD A2C:     70.0 ml LV SV MOD A4C:     171.0 ml LV SV MOD BP:      74.4 ml RIGHT VENTRICLE             IVC RV Basal diam:  3.70 cm     IVC diam: 1.90 cm RV S prime:     10.50 cm/s LEFT ATRIUM             Index        RIGHT ATRIUM           Index LA diam:        3.10 cm 1.26 cm/m   RA Area:     24.00 cm LA Vol (A2C):   69.5 ml 28.35 ml/m  RA Volume:   77.50 ml  31.62 ml/m LA Vol (A4C):   56.3 ml 22.97 ml/m LA Biplane Vol: 61.6 ml 25.13 ml/m  AORTIC VALVE AV Area (Vmax):    3.24 cm AV Area (Vmean):   3.18 cm AV Area (VTI):     3.58 cm AV Vmax:           110.00 cm/s AV Vmean:          69.300 cm/s AV VTI:            0.184 m AV Peak Grad:      4.8 mmHg AV Mean Grad:      2.0 mmHg LVOT Vmax:  103.00 cm/s LVOT Vmean:        63.700 cm/s LVOT VTI:          0.190 m LVOT/AV VTI ratio: 1.03  AORTA Ao Root diam: 3.10 cm Ao Asc diam:  3.70 cm MITRAL VALVE MV Area (PHT): 3.58 cm    SHUNTS MV Decel Time: 212 msec    Systemic VTI:  0.19 m MV E velocity: 73.40 cm/s  Systemic Diam: 2.10 cm  MV A velocity: 51.60 cm/s MV E/A ratio:  1.42 Fransico Him MD Electronically signed by Fransico Him MD Signature Date/Time: 05/11/2021/4:02:54 PM    Final      Assessment and Plan:   Flash pulmonary edema  -Occurred in the setting of uncontrolled hypertension. -Received 2 doses of 60 mg IV Lasix on 1/27 and 1 more dose this morning.  Creatinine has went up from 1.8 to 2.0 this morning. -On physical exam, patient is near euvolemic level with only trace diminished breath sounds in bilateral bases.  I suspect the patient can be switched to oral Lasix at 20 mg daily from this point forth.  -repeat BMET in 5 days as outpatient  Uncontrolled hypertension: started on amlodipine, consider addition of coreg 3.125mg  BID since patient is near euvolemic at this point. Will attempt add ARB as outpatient  Abnormal echocardiogram: reviewed with Dr. Marlou Mccarty, appears to have global hypokinesis without obvious regional variation. Suspect drop in EF is more likely due to uncontrolled HTN. Recommend treat BP and place on GDMT and repeat echo in 3 month as outpatient and proceed with ischemic workup only if EF does not improve.   Elevated troponin: 36->55-->77-->101. Denies any recent chest pain, suspect demand ischemia in the setting of flash pulmonary edema  Newly diagnosed DM2: per primary team.   Acute on chronic renal insufficiency: need repeat BMET 5 days after discharge.  Obstructive sleep apnea: Compliant with CPAP therapy at home   Risk Assessment/Risk Scores:        New York Heart Association (NYHA) Functional Class NYHA Class III        For questions or updates, please contact [facility] Please consult [email or web address] for contact info under    Signed, Almyra Deforest, [place]  05/12/2021 11:07 AM  Personally seen and examined. Agree with above.  61 year old male with hypertensive urgency, shortness of breath interstitial edema noted on chest x-ray unremarkable EKG with elevated high-sensitivity  troponin of approximately 101.  Feels much better after 3 doses of 60 mg IV of Lasix.  Good diuresis.  In fact he is saying he wishes he could go home.  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  Never had any chest discomfort.  On exam no significant crackles, regular rate and rhythm protuberant abdomen.  EKG shows no significant ST changes telemetry personally reviewed shows a short 4 beat run of NSVT.  Echocardiogram demonstrated EF of 40 to 45% mildly to moderately reduced.  There is note of regional wall motion abnormality however upon personally reviewing, his echo looks more generalized hypokinetic versus regional wall motion.  Global hypokinesis.  Creatinine 1.8 on admission up to 2.03 post diuresis.  Was 1.4-1.6 previously.  Liver enzymes are also mildly elevated AST/ALT 120/87, now 75/86  LDL 98 hemoglobin A1c in the 7 range, diabetic.  Assessment and plan  Pulmonary edema likely from hypertensive urgency Acute systolic heart failure Demand ischemia Diabetes Morbid obesity  - Agree with previous diuresis.  Monitoring creatinine closely.  Slight increased tolerated. - Agree with amlodipine  and starting carvedilol.  At some point may be able to tolerate angiotensin receptor blocker however creatinine may be limiting. -He is not a candidate for Entresto because of his creatinine at this time.  Also I would avoid spironolactone as well. -Could he take SGLT2 inhibitor? -No cardiac catheterization at this time.  Repeat echocardiogram in 3 to 6 months after adequate hypertension control.  If EF remains low, could consider diagnostic angiogram at that time.  Candee Furbish, MD

## 2021-05-12 NOTE — Progress Notes (Signed)
Patient states, "I just found out today I have Diabetes." Informed patient of educational channel and order Diabetic book as well as welll as CHF and High B.Books. Patient watch CHF video and tried to watch high B.P. video. But T.V. channel stop working. Informed of all Diabetic channels to watch. He will try again in a.m.

## 2021-05-12 NOTE — Progress Notes (Addendum)
HD#0 Subjective:  Overnight Events: NAEO   Patient feeling a whole lot better. Says that when he first came into the hospital he wasn't able to breath at all. He is anxious to go home soon but understands having an evaluation of his heart is important.   Objective:  Vital signs in last 24 hours: Vitals:   05/11/21 2029 05/11/21 2203 05/12/21 0000 05/12/21 0430  BP: (!) 156/71  (!) 161/87 (!) 169/96  Pulse: 66 65 68 64  Resp: 12 18 16 20   Temp: 98.7 F (37.1 C)  (!) 97.5 F (36.4 C) 98 F (36.7 C)  TempSrc: Oral  Axillary Oral  SpO2: 95% 96% 94% 98%  Weight:    (!) 136.5 kg  Height:       Supplemental O2: Room Air SpO2: 98 % O2 Flow Rate (L/min): 2 L/min FiO2 (%): 40 %   Physical Exam:  Constitutional: Obese appearing male resting comfortably in bed, in no acute distress HENT: normocephalic atraumatic, mucous membranes moist Eyes: conjunctiva non-erythematous Neck: supple Cardiovascular: regular rate and rhythm, no m/r/g, trace pitting edema Pulmonary/Chest: normal work of breathing on room air, lungs clear to auscultation bilaterally Abdominal: soft, non-tender, distended MSK: normal bulk and tone Neurological: alert & oriented x 3 Skin: warm and dry Psych: normal affect  Filed Weights   05/11/21 0342 05/11/21 1852 05/12/21 0430  Weight: 129.3 kg (!) 139.1 kg (!) 136.5 kg     Intake/Output Summary (Last 24 hours) at 05/12/2021 0726 Last data filed at 05/11/2021 2100 Gross per 24 hour  Intake 240 ml  Output 1930 ml  Net -1690 ml   Net IO Since Admission: -2,590 mL [05/12/21 0726]  Pertinent Labs: CBC Latest Ref Rng & Units 05/11/2021 05/11/2021 12/08/2020  WBC 4.0 - 10.5 K/uL - 9.0 8.5  Hemoglobin 13.0 - 17.0 g/dL 15.0 14.7 15.5  Hematocrit 39.0 - 52.0 % 44.0 46.0 45.9  Platelets 150 - 400 K/uL - 209 209    CMP Latest Ref Rng & Units 05/12/2021 05/11/2021 05/11/2021  Glucose 70 - 99 mg/dL 175(H) 188(H) -  BUN 8 - 23 mg/dL 22 18 -  Creatinine 0.61 - 1.24  mg/dL 2.03(H) 1.80(H) -  Sodium 135 - 145 mmol/L 139 139 140  Potassium 3.5 - 5.1 mmol/L 3.5 3.9 4.4  Chloride 98 - 111 mmol/L 101 103 -  CO2 22 - 32 mmol/L 28 28 -  Calcium 8.9 - 10.3 mg/dL 8.5(L) 8.7(L) -  Total Protein 6.5 - 8.1 g/dL - 7.1 -  Total Bilirubin 0.3 - 1.2 mg/dL - 0.7 -  Alkaline Phos 38 - 126 U/L - 76 -  AST 15 - 41 U/L - 75(H) -  ALT 0 - 44 U/L - 86(H) -    Imaging: US RENAL  Result Date: 05/11/2021 CLINICAL DATA:  Acute renal insufficiency EXAM: RENAL / URINARY TRACT ULTRASOUND COMPLETE COMPARISON:  None. FINDINGS: Right Kidney: Renal measurements: 10.8 x 6.2 x 6.7 cm = volume: 216 mL. The lower pole is not well visualized due to overlying bowel gas. Echogenicity within normal limits. No mass or hydronephrosis visualized. Left Kidney: Renal measurements: 10.9 x 6.0 x 6.0 cm = volume: 108 mL. Echogenicity within normal limits. No mass or hydronephrosis visualized. Bladder: Appears normal for degree of bladder distention although only mild distention limits evaluation. Other: None. IMPRESSION: Normal appearance of the bilateral kidneys. Electronically Signed   By: Yvonne Kendall M.D.   On: 05/11/2021 14:45   ECHOCARDIOGRAM COMPLETE  Result Date:  05/11/2021    ECHOCARDIOGRAM REPORT   Patient Name:   JAHZIR STROHMEIER Date of Exam: 05/11/2021 Medical Rec #:  [id number]    Height:       71.0 in Accession #:    [id number]   Weight:       285.0 lb Date of Birth:  [birth year]     BSA:          2.451 m Patient Age:    61 years     BP:           162/83 mmHg Patient Gender: M            HR:           75 bpm. Exam Location:  Inpatient Procedure: 2D Echo, Cardiac Doppler and Color Doppler Indications:    CHF  History:        Patient has no prior history of Echocardiogram examinations.                 Signs/Symptoms:Shortness of Breath; Risk Factors:Sleep Apnea.  Sonographer:    Clayton Lefort RDCS (AE) Referring Phys: [id number] [address]  Sonographer Comments: Patient is morbidly obese. Image acquisition  challenging due to patient body habitus. IMPRESSIONS  1. Left ventricular ejection fraction, by estimation, is 40 to 45%. The left ventricle has mildly decreased function. The left ventricle demonstrates regional wall motion abnormalities (see scoring diagram/findings for description). There is severe concentric left ventricular hypertrophy. Left ventricular diastolic parameters are indeterminate. Elevated left ventricular end-diastolic pressure.  2. Right ventricular systolic function was not well visualized. The right ventricular size is normal. Tricuspid regurgitation signal is inadequate for assessing PA pressure.  3. The mitral valve is normal in structure. Trivial mitral valve regurgitation. No evidence of mitral stenosis.  4. The aortic valve is normal in structure. Aortic valve regurgitation is not visualized. No aortic stenosis is present.  5. There is borderline dilatation of the ascending aorta, measuring 37 mm.  6. The inferior vena cava is normal in size with greater than 50% respiratory variability, suggesting right atrial pressure of 3 mmHg. FINDINGS  Left Ventricle: Left ventricular ejection fraction, by estimation, is 40 to 45%. The left ventricle has mildly decreased function. The left ventricle demonstrates regional wall motion abnormalities. The left ventricular internal cavity size was normal in size. There is severe concentric left ventricular hypertrophy. Left ventricular diastolic parameters are indeterminate. Elevated left ventricular end-diastolic pressure. Right Ventricle: The right ventricular size is normal. No increase in right ventricular wall thickness. Right ventricular systolic function was not well visualized. Tricuspid regurgitation signal is inadequate for assessing PA pressure. Left Atrium: Left atrial size was not well visualized. Right Atrium: Right atrial size was not well visualized. Pericardium: There is no evidence of pericardial effusion. Mitral Valve: The mitral valve is  normal in structure. Trivial mitral valve regurgitation. No evidence of mitral valve stenosis. Tricuspid Valve: The tricuspid valve is normal in structure. Tricuspid valve regurgitation is not demonstrated. No evidence of tricuspid stenosis. Aortic Valve: The aortic valve is normal in structure. Aortic valve regurgitation is not visualized. No aortic stenosis is present. Aortic valve mean gradient measures 2.0 mmHg. Aortic valve peak gradient measures 4.8 mmHg. Aortic valve area, by VTI measures 3.58 cm. Pulmonic Valve: The pulmonic valve was normal in structure. Pulmonic valve regurgitation is not visualized. No evidence of pulmonic stenosis. Aorta: The aortic root is normal in size and structure. There is borderline dilatation of the ascending aorta, measuring 37 mm. Venous:  The inferior vena cava is normal in size with greater than 50% respiratory variability, suggesting right atrial pressure of 3 mmHg. IAS/Shunts: The interatrial septum appears to be lipomatous. No atrial level shunt detected by color flow Doppler.  LEFT VENTRICLE PLAX 2D LVIDd:         5.50 cm      Diastology LVIDs:         4.50 cm      LV e' medial:    4.74 cm/s LV PW:         2.10 cm      LV E/e' medial:  15.5 LV IVS:        2.00 cm      LV e' lateral:   4.47 cm/s LVOT diam:     2.10 cm      LV E/e' lateral: 16.4 LV SV:         66 LV SV Index:   27 LVOT Area:     3.46 cm  LV Volumes (MOD) LV vol d, MOD A2C: 172.0 ml LV vol d, MOD A4C: 171.0 ml LV vol s, MOD A2C: 102.0 ml LV vol s, MOD A4C: 102.0 ml LV SV MOD A2C:     70.0 ml LV SV MOD A4C:     171.0 ml LV SV MOD BP:      74.4 ml RIGHT VENTRICLE             IVC RV Basal diam:  3.70 cm     IVC diam: 1.90 cm RV S prime:     10.50 cm/s LEFT ATRIUM             Index        RIGHT ATRIUM           Index LA diam:        3.10 cm 1.26 cm/m   RA Area:     24.00 cm LA Vol (A2C):   69.5 ml 28.35 ml/m  RA Volume:   77.50 ml  31.62 ml/m LA Vol (A4C):   56.3 ml 22.97 ml/m LA Biplane Vol: 61.6 ml 25.13  ml/m  AORTIC VALVE AV Area (Vmax):    3.24 cm AV Area (Vmean):   3.18 cm AV Area (VTI):     3.58 cm AV Vmax:           110.00 cm/s AV Vmean:          69.300 cm/s AV VTI:            0.184 m AV Peak Grad:      4.8 mmHg AV Mean Grad:      2.0 mmHg LVOT Vmax:         103.00 cm/s LVOT Vmean:        63.700 cm/s LVOT VTI:          0.190 m LVOT/AV VTI ratio: 1.03  AORTA Ao Root diam: 3.10 cm Ao Asc diam:  3.70 cm MITRAL VALVE MV Area (PHT): 3.58 cm    SHUNTS MV Decel Time: 212 msec    Systemic VTI:  0.19 m MV E velocity: 73.40 cm/s  Systemic Diam: 2.10 cm MV A velocity: 51.60 cm/s MV E/A ratio:  1.42 Fransico Him MD Electronically signed by Fransico Him MD Signature Date/Time: 05/11/2021/4:02:54 PM    Final     Assessment/Plan:   Principal Problem:   Severe hypertension Active Problems:   Acute respiratory failure with hypercapnia (HCC)   Acute congestive heart failure (HCC)  Obstructive sleep apnea   AKI (acute kidney injury) ([place])   Type 2 diabetes mellitus with kidney complication, without long-term current use of insulin ([place])   Morbid obesity ([place])   Patient Summary: Jason Mccarty is a 61 y.o. with a pertinent PMH of HTN not on current treatment who presented with shortness of breath and and severe hypertension admitted for severe asymptomatic hypertension and new diagnosis of HFrEF   #Severe hypertension Patient previously not on antihypertensives. Blood pressure on admission above 200. Now 150-160s. Holding off on ace/arbs due to AKI below but will benefit from adjustment of therapy to GDMT once condition improves. - amlodipine 10 daily  #HFrEF Patient's echo showed an EF of 40-45% with regional wall motion abnormalities. Will consult cardiology today to see whether any interventions are required given new diagnosis. - strict I/O - cardiac monitoring - daily weights - K >4, Mg >2 - cardiology consult appreciate assistance - furosemide 60 IV daily  #AKI or CKD Stage 3b? Creatinine  slightly up today at 2.0.  - avoiding nephrotoxic medations; holding arb - daily bmp - strict I/O  #Hepatocellular Injury BP AST and ALT show liver enzymes are trending down, expect this could represent some minor ischemia from  episode of acute hypoxic respiratory failure. - cmp repeat at hospital follow up  #T2DM New diagnosis this admission. A1c 7.3 - start metformin on discharge - ssi - diabetic diet   Prior to Admission Living Arrangement: Home Anticipated Discharge Location: Home Barriers to Discharge: Ischemic eval/HF workup Dispo: Anticipated discharge to Home in 2 days  Scarlett Presto, MD Internal Medicine Resident PGY-1 Pager [phone] Please contact the on call pager after 5 pm and on weekends at [phone].

## 2021-05-12 NOTE — Progress Notes (Signed)
Inpatient Diabetes Program Recommendations  AACE/ADA: New Consensus Statement on Inpatient Glycemic Control (2015)  Target Ranges:  Prepandial:   less than 140 mg/dL      Peak postprandial:   less than 180 mg/dL (1-2 hours)      Critically ill patients:  140 - 180 mg/dL   Lab Results  Component Value Date   GLUCAP 143 (H) 05/12/2021   HGBA1C 7.3 (H) 05/11/2021    Review of Glycemic Control  Latest Reference Range & Units 05/11/21 21:31 05/12/21 06:09 05/12/21 11:28  Glucose-Capillary 70 - 99 mg/dL 183 (H) 162 (H) 143 (H)   Diabetes history: DM 2-New diagnosis Outpatient Diabetes medications:  None Current orders for Inpatient glycemic control:  Novolog moderate tid with meals  Inpatient Diabetes Program Recommendations:    Referral received regarding new diagnosis of DM.  Called patient by phone (Diabetes coordinator working remotely). Spoke with pt about new diagnosis.  Discussed A1C results with him and explained what an A1C is, basic pathophysiology of DM Type 2, basic home care, importance of checking CBGs and maintaining good CBG control to prevent long-term and short-term complications. Have ordered educational booklet.  Encouraged close follow-up with PCP- Patient states that he is planning to get new PCP.    Consider ordering glucose meter for patient to monitor at home.  Also consider starting PO agent for DM for patient (?metformin 500 mg bid).  Thanks,  Adah Perl, RN, BC-ADM Inpatient Diabetes Coordinator Pager [phone]   (8a-5p)

## 2021-05-13 DIAGNOSIS — E1122 Type 2 diabetes mellitus with diabetic chronic kidney disease: Secondary | ICD-10-CM

## 2021-05-13 DIAGNOSIS — N179 Acute kidney failure, unspecified: Secondary | ICD-10-CM

## 2021-05-13 DIAGNOSIS — I110 Hypertensive heart disease with heart failure: Secondary | ICD-10-CM

## 2021-05-13 DIAGNOSIS — I5020 Unspecified systolic (congestive) heart failure: Secondary | ICD-10-CM

## 2021-05-13 DIAGNOSIS — S36119A Unspecified injury of liver, initial encounter: Secondary | ICD-10-CM

## 2021-05-13 DIAGNOSIS — N1832 Chronic kidney disease, stage 3b: Secondary | ICD-10-CM

## 2021-05-13 DIAGNOSIS — I130 Hypertensive heart and chronic kidney disease with heart failure and stage 1 through stage 4 chronic kidney disease, or unspecified chronic kidney disease: Secondary | ICD-10-CM

## 2021-05-13 LAB — CBC
HCT: 41.9 % (ref 39.0–52.0)
Hemoglobin: 13.7 g/dL (ref 13.0–17.0)
MCH: 29.3 pg (ref 26.0–34.0)
MCHC: 32.7 g/dL (ref 30.0–36.0)
MCV: 89.7 fL (ref 80.0–100.0)
Platelets: 220 10*3/uL (ref 150–400)
RBC: 4.67 MIL/uL (ref 4.22–5.81)
RDW: 13 % (ref 11.5–15.5)
WBC: 9 10*3/uL (ref 4.0–10.5)
nRBC: 0 % (ref 0.0–0.2)

## 2021-05-13 LAB — BASIC METABOLIC PANEL
Anion gap: 11 (ref 5–15)
BUN: 24 mg/dL — ABNORMAL HIGH (ref 8–23)
CO2: 27 mmol/L (ref 22–32)
Calcium: 8.4 mg/dL — ABNORMAL LOW (ref 8.9–10.3)
Chloride: 99 mmol/L (ref 98–111)
Creatinine, Ser: 1.9 mg/dL — ABNORMAL HIGH (ref 0.61–1.24)
GFR, Estimated: 40 mL/min — ABNORMAL LOW (ref >60)
Glucose, Bld: 149 mg/dL — ABNORMAL HIGH (ref 70–99)
Potassium: 3.7 mmol/L (ref 3.5–5.1)
Sodium: 137 mmol/L (ref 135–145)

## 2021-05-13 LAB — GLUCOSE, CAPILLARY: Glucose-Capillary: 171 mg/dL — ABNORMAL HIGH (ref 70–99)

## 2021-05-13 LAB — MAGNESIUM: Magnesium: 2 mg/dL (ref 1.7–2.4)

## 2021-05-13 MED ORDER — AMLODIPINE BESYLATE 10 MG PO TABS: 10.0000 mg | ORAL_TABLET | Freq: Every day | ORAL | 0 refills | Status: DC

## 2021-05-13 MED ORDER — DAPAGLIFLOZIN PROPANEDIOL 5 MG PO TABS: 5.0000 mg | ORAL_TABLET | Freq: Every day | ORAL | 0 refills | Status: DC

## 2021-05-13 MED ORDER — CARVEDILOL 3.125 MG PO TABS: 3.1250 mg | ORAL_TABLET | Freq: Two times a day (BID) | ORAL | 0 refills | Status: DC

## 2021-05-13 MED ORDER — FUROSEMIDE 20 MG PO TABS: 20.0000 mg | ORAL_TABLET | Freq: Every day | ORAL | 0 refills | Status: DC

## 2021-05-13 MED ORDER — POTASSIUM CHLORIDE CRYS ER 20 MEQ PO TBCR
40.0000 meq | EXTENDED_RELEASE_TABLET | Freq: Once | ORAL | Status: AC
  Administered 2021-05-13: 40 meq via ORAL
  Filled 2021-05-13: qty 2

## 2021-05-13 NOTE — Discharge Instructions (Signed)
Jason Mccarty  You were recently admitted to [facility] for shortness of breath. We found that your heart was not beating as strongly as it should and that your blood pressure was elevated. We treated you with fluid pills to help your breathing improve and have you pee off the extra fluid and help your heart. We also diagnosed you with type two diabetes. It will be important for you to follow with [place] in clinic and to continue taking your medications.   Continue taking your home medications with the following changes  Start taking norvasc 10 mg daily Start taking carvedilol 3.125 twice daily Start taking lasix 20 mg in the morning Start taking farxiga 5 mg daily  You should seek further medical care if you have worsening shortness of breath, leg swelling, difficulty breathing, chest pain, or lightheadendness.  We recommend that you see [place] in clinic in about a week to make sure that you continue to improve. They will call you to schedule an appointment. We are so glad that you are feeling better.  Sincerely, Scarlett Presto, MD

## 2021-05-13 NOTE — Progress Notes (Signed)
Order received to discharge patient.  Telemetry monitor removed and CCMD notified.  PIV access removed.  Discharge instructions, follow up, medications and instructions for their use discussed with patient. 

## 2021-05-13 NOTE — TOC Transition Note (Signed)
Transition of Care [facility]) - CM/SW Discharge Note   Patient Details  Name: Jason Mccarty MRN: [id number] Date of Birth: [birth year]  [facility] CM/SW Contact:  Konrad Penta, RN Phone Number: [phone] 05/13/2021, 10:57 AM   Clinical Narrative:   Jason Mccarty to transition home today. Provided Mr. Jason Mccarty prescription discount card. Also discussed the need to find PCP. Clinics are closed over the weekend. Writer provided list of [place] for Jason Mccarty to call to establish care with PCP. Also placed PCP information on AVS. Jason Mccarty also requested note for work. Made MD aware and work excuse note was provided to patient.   Jason Mccarty endorses he lives with his significant other. Denies having any other needs.      Final next level of care: Home/Self Care Barriers to Discharge: No Barriers Identified   Patient Goals and CMS Choice Patient states their goals for this hospitalization and ongoing recovery are:: return home      Discharge Placement                       Discharge Plan and Services                                     Social Determinants of Health (SDOH) Interventions     Readmission Risk Interventions No flowsheet data found.

## 2021-05-13 NOTE — Discharge Summary (Signed)
Name: Jason Mccarty MRN: [id number] DOB: Jun 21, 1960 61 y.o. PCP: Pcp, No  Date of Admission: 05/11/2021  3:37 AM Date of Discharge:   05/13/21 Attending Physician: Dr.  Aldine Contes  Discharge Diagnosis: 1. Hypertensive urgency 2. HFrEF 3. AKI on CKD Stage 3b 4. Hepatocellular Injury 5. T2DM  Discharge Medications: Allergies as of 05/13/2021       Reactions   Benadryl [diphenhydramine] Itching        Medication List     STOP taking these medications    doxycycline 100 MG capsule Commonly known as: VIBRAMYCIN   ibuprofen 600 MG tablet Commonly known as: ADVIL   predniSONE 50 MG tablet Commonly known as: DELTASONE       TAKE these medications    amLODipine 10 MG tablet Commonly known as: NORVASC Take 1 tablet (10 mg total) by mouth daily.   aspirin EC 81 MG tablet Take 81 mg by mouth. Once a week   carvedilol 3.125 MG tablet Commonly known as: COREG Take 1 tablet (3.125 mg total) by mouth 2 (two) times daily with a meal.   dapagliflozin propanediol 5 MG Tabs tablet Commonly known as: Farxiga Take 1 tablet (5 mg total) by mouth daily before breakfast.   furosemide 20 MG tablet Commonly known as: LASIX Take 1 tablet (20 mg total) by mouth daily.        Disposition and follow-up:   [name] was discharged from [facility] in Stable condition.  At the hospital follow up visit please address:  1.  HFrEF-Optimize patient's GDMT and make sure he follows with cardiology  2. HTN- adjust meds as needed  3. Newly diagnosed DMII- adjust meds as needed/make sure he does not need prior auth for dapagliflozin  4. Hepatocellular injury- recheck cmp  5.  Labs / imaging needed at time of follow-up: cmp  6.  Pending labs/ test needing follow-up: none  Follow-up Appointments:  Message sent to [facility]MTS clinic to schedule a follow up appointment for the next week  Hospital Course by problem list: 1. Hypertensive urgency Patient came  in acutely short of breath with reported BP of 300s per EMS, was greater than 200 when arriving to the hospital and improved with nitro. CXR showed bilateral opacities in the mid and lower lungs fields thought to be related to flash pulmonary edema. Patient received dose of irbesartan though this was discontinued given patient's AKI. Blood pressures were controlled to the 140-160s with amlodipine 10 and carvedilol 3.125. Shortness of breath resolved and patient deemed stable for discharge. Will continue to optimize his medications as an outpatient.   2. HFrEF Echo obtained on admission showed a 40-45% with regional wall motion abnormalities. Given new diagnosis cardiology was consulted. They recommended optimizing GDMT and did not believe an ischemic eval was needed while inpatient. He responded well to IV diuresis with furosemide during admission and was not largely volume overloaded. He will be discharged on furosemide 20, carvedilol 3.125 and dapagliflozin. Given his kidney function he was not a candidate for entresto, spiro, or ACE/ARB though this can be revisited in the future. Recommend repeat echo in 3-6 months after optimization of BP control.   3. AKI on CKD Stage 3b Creatinine elevated to 1.8 on admission. Only prior comparison available from 5 months ago showed a creatinine of 1.46. Unclear baseline. Creatinine did slightly bump to 2 after diuresis but improved to 1.9. Nephrotoxic medications were avoided and patient will be recommended to have creatinine rechecked as an  outpatient to better establish his baseline and see if he is able to tolerate ace/arb as above.  4. Hepatocellular Injury Patient's liver enzymes elevated on admission AST 120 ALT 87. Could have be related to hypertensive urgency vs fatty liver disease from NASH. Labs improved with supportive care to AST 75 and ALT 86 during admission. Recommend follow up as an outpatient.   5. T2DM Newly diagnosed during this admission. A1c  found to be 7.3 on admission. Patient placed on low dose sliding scale while inpatient and will be discharged on dapagliflozin to follow up as an outpatient.  Discharge Exam:   BP 139/83 (BP Location: Left Arm)    Pulse 65    Temp 98.1 F (36.7 C) (Oral)    Resp 17    Ht 5\' 11"  (1.803 m)    Wt 134.8 kg    SpO2 100%    BMI 41.45 kg/m  Constitutional: Obese appearing male resting comfortably in bed, in no acute distress HENT: normocephalic atraumatic, mucous membranes moist Eyes: conjunctiva non-erythematous Neck: supple Cardiovascular: regular rate and rhythm, no m/r/g, trace pitting edema Pulmonary/Chest: normal work of breathing on room air, lungs clear to auscultation bilaterally Abdominal: soft, non-tender, distended MSK: normal bulk and tone Neurological: alert & oriented x 3 Skin: warm and dry Psych: normal affect    Pertinent Labs, Studies, and Procedures:   US RENAL  Result Date: 05/11/2021 CLINICAL DATA:  Acute renal insufficiency EXAM: RENAL / URINARY TRACT ULTRASOUND COMPLETE COMPARISON:  None. FINDINGS: Right Kidney: Renal measurements: 10.8 x 6.2 x 6.7 cm = volume: 216 mL. The lower pole is not well visualized due to overlying bowel gas. Echogenicity within normal limits. No mass or hydronephrosis visualized. Left Kidney: Renal measurements: 10.9 x 6.0 x 6.0 cm = volume: 108 mL. Echogenicity within normal limits. No mass or hydronephrosis visualized. Bladder: Appears normal for degree of bladder distention although only mild distention limits evaluation. Other: None. IMPRESSION: Normal appearance of the bilateral kidneys. Electronically Signed   By: Yvonne Kendall M.D.   On: 05/11/2021 14:45   DG Chest Port 1 View  Result Date: 05/11/2021 CLINICAL DATA:  61 year old male with history of sudden onset of shortness of breath. EXAM: PORTABLE CHEST 1 VIEW COMPARISON:  No priors. FINDINGS: Ill-defined opacities and areas of interstitial prominence throughout the mid to lower lungs  bilaterally. More dense opacity in the medial aspect of the left lung base, favored to reflect subsegmental atelectasis. Small left pleural effusion. No pneumothorax. No evidence of pulmonary edema. Heart size is normal. The patient is rotated to the left on today's exam, resulting in distortion of the mediastinal contours and reduced diagnostic sensitivity and specificity for mediastinal pathology. IMPRESSION: 1. Ill-defined opacities and areas of interstitial prominence in the mid to lower lungs bilaterally, which could be seen in the setting of acute viral infection. 2. Small left-sided pleural effusion with probable subsegmental atelectasis in the left lower lobe, although focal area of airspace consolidation is not excluded. 1. Electronically Signed   By: Vinnie Langton M.D.   On: 05/11/2021 05:01   ECHOCARDIOGRAM COMPLETE  Result Date: 05/11/2021    ECHOCARDIOGRAM REPORT   Patient Name:   Jason Mccarty Date of Exam: 05/11/2021 Medical Rec #:  [id number]    Height:       71.0 in Accession #:    [id number]   Weight:       285.0 lb Date of Birth:  February 24, 1961     BSA:  2.451 m Patient Age:    27 years     BP:           162/83 mmHg Patient Gender: M            HR:           75 bpm. Exam Location:  Inpatient Procedure: 2D Echo, Cardiac Doppler and Color Doppler Indications:    CHF  History:        Patient has no prior history of Echocardiogram examinations.                 Signs/Symptoms:Shortness of Breath; Risk Factors:Sleep Apnea.  Sonographer:    Clayton Lefort RDCS (AE) Referring Phys: [id number]  Sonographer Comments: Patient is morbidly obese. Image acquisition challenging due to patient body habitus. IMPRESSIONS  1. Left ventricular ejection fraction, by estimation, is 40 to 45%. The left ventricle has mildly decreased function. The left ventricle demonstrates regional wall motion abnormalities (see scoring diagram/findings for description). There is severe concentric left ventricular  hypertrophy. Left ventricular diastolic parameters are indeterminate. Elevated left ventricular end-diastolic pressure.  2. Right ventricular systolic function was not well visualized. The right ventricular size is normal. Tricuspid regurgitation signal is inadequate for assessing PA pressure.  3. The mitral valve is normal in structure. Trivial mitral valve regurgitation. No evidence of mitral stenosis.  4. The aortic valve is normal in structure. Aortic valve regurgitation is not visualized. No aortic stenosis is present.  5. There is borderline dilatation of the ascending aorta, measuring 37 mm.  6. The inferior vena cava is normal in size with greater than 50% respiratory variability, suggesting right atrial pressure of 3 mmHg. FINDINGS  Left Ventricle: Left ventricular ejection fraction, by estimation, is 40 to 45%. The left ventricle has mildly decreased function. The left ventricle demonstrates regional wall motion abnormalities. The left ventricular internal cavity size was normal in size. There is severe concentric left ventricular hypertrophy. Left ventricular diastolic parameters are indeterminate. Elevated left ventricular end-diastolic pressure. Right Ventricle: The right ventricular size is normal. No increase in right ventricular wall thickness. Right ventricular systolic function was not well visualized. Tricuspid regurgitation signal is inadequate for assessing PA pressure. Left Atrium: Left atrial size was not well visualized. Right Atrium: Right atrial size was not well visualized. Pericardium: There is no evidence of pericardial effusion. Mitral Valve: The mitral valve is normal in structure. Trivial mitral valve regurgitation. No evidence of mitral valve stenosis. Tricuspid Valve: The tricuspid valve is normal in structure. Tricuspid valve regurgitation is not demonstrated. No evidence of tricuspid stenosis. Aortic Valve: The aortic valve is normal in structure. Aortic valve regurgitation is not  visualized. No aortic stenosis is present. Aortic valve mean gradient measures 2.0 mmHg. Aortic valve peak gradient measures 4.8 mmHg. Aortic valve area, by VTI measures 3.58 cm. Pulmonic Valve: The pulmonic valve was normal in structure. Pulmonic valve regurgitation is not visualized. No evidence of pulmonic stenosis. Aorta: The aortic root is normal in size and structure. There is borderline dilatation of the ascending aorta, measuring 37 mm. Venous: The inferior vena cava is normal in size with greater than 50% respiratory variability, suggesting right atrial pressure of 3 mmHg. IAS/Shunts: The interatrial septum appears to be lipomatous. No atrial level shunt detected by color flow Doppler.  LEFT VENTRICLE PLAX 2D LVIDd:         5.50 cm      Diastology LVIDs:         4.50 cm  LV e' medial:    4.74 cm/s LV PW:         2.10 cm      LV E/e' medial:  15.5 LV IVS:        2.00 cm      LV e' lateral:   4.47 cm/s LVOT diam:     2.10 cm      LV E/e' lateral: 16.4 LV SV:         66 LV SV Index:   27 LVOT Area:     3.46 cm  LV Volumes (MOD) LV vol d, MOD A2C: 172.0 ml LV vol d, MOD A4C: 171.0 ml LV vol s, MOD A2C: 102.0 ml LV vol s, MOD A4C: 102.0 ml LV SV MOD A2C:     70.0 ml LV SV MOD A4C:     171.0 ml LV SV MOD BP:      74.4 ml RIGHT VENTRICLE             IVC RV Basal diam:  3.70 cm     IVC diam: 1.90 cm RV S prime:     10.50 cm/s LEFT ATRIUM             Index        RIGHT ATRIUM           Index LA diam:        3.10 cm 1.26 cm/m   RA Area:     24.00 cm LA Vol (A2C):   69.5 ml 28.35 ml/m  RA Volume:   77.50 ml  31.62 ml/m LA Vol (A4C):   56.3 ml 22.97 ml/m LA Biplane Vol: 61.6 ml 25.13 ml/m  AORTIC VALVE AV Area (Vmax):    3.24 cm AV Area (Vmean):   3.18 cm AV Area (VTI):     3.58 cm AV Vmax:           110.00 cm/s AV Vmean:          69.300 cm/s AV VTI:            0.184 m AV Peak Grad:      4.8 mmHg AV Mean Grad:      2.0 mmHg LVOT Vmax:         103.00 cm/s LVOT Vmean:        63.700 cm/s LVOT VTI:           0.190 m LVOT/AV VTI ratio: 1.03  AORTA Ao Root diam: 3.10 cm Ao Asc diam:  3.70 cm MITRAL VALVE MV Area (PHT): 3.58 cm    SHUNTS MV Decel Time: 212 msec    Systemic VTI:  0.19 m MV E velocity: 73.40 cm/s  Systemic Diam: 2.10 cm MV A velocity: 51.60 cm/s MV E/A ratio:  1.42 Fransico Him MD Electronically signed by Fransico Him MD Signature Date/Time: 05/11/2021/4:02:54 PM    Final     CBC Latest Ref Rng & Units 05/13/2021 05/11/2021 05/11/2021  WBC 4.0 - 10.5 K/uL 9.0 - 9.0  Hemoglobin 13.0 - 17.0 g/dL 13.7 15.0 14.7  Hematocrit 39.0 - 52.0 % 41.9 44.0 46.0  Platelets 150 - 400 K/uL 220 - 209    BMP Latest Ref Rng & Units 05/13/2021 05/12/2021 05/11/2021  Glucose 70 - 99 mg/dL 149(H) 175(H) 188(H)  BUN 8 - 23 mg/dL 24(H) 22 18  Creatinine 0.61 - 1.24 mg/dL 1.90(H) 2.03(H) 1.80(H)  Sodium 135 - 145 mmol/L 137 139 139  Potassium 3.5 - 5.1 mmol/L 3.7 3.5 3.9  Chloride 98 - 111 mmol/L 99 101 103  CO2 22 - 32 mmol/L 27 28 28   Calcium 8.9 - 10.3 mg/dL 8.4(L) 8.5(L) 8.7(L)    Discharge Instructions: Discharge Instructions     Call MD for:  difficulty breathing, headache or visual disturbances   Complete by: As directed    Call MD for:  persistant dizziness or light-headedness   Complete by: As directed    Diet - low sodium heart healthy   Complete by: As directed    Increase activity slowly   Complete by: As directed        Signed: Scarlett Presto, MD 05/13/2021, 9:41 AM   Pager: [phone]

## 2021-05-17 ENCOUNTER — Ambulatory Visit (INDEPENDENT_AMBULATORY_CARE_PROVIDER_SITE_OTHER): Payer: Commercial Managed Care - PPO | Admitting: Student

## 2021-05-17 ENCOUNTER — Encounter: Payer: Self-pay | Admitting: Student

## 2021-05-17 VITALS — BP 156/78 | HR 70 | Wt 306.1 lb

## 2021-05-17 DIAGNOSIS — K769 Liver disease, unspecified: Secondary | ICD-10-CM

## 2021-05-17 DIAGNOSIS — I509 Heart failure, unspecified: Secondary | ICD-10-CM | POA: Diagnosis not present

## 2021-05-17 DIAGNOSIS — N179 Acute kidney failure, unspecified: Secondary | ICD-10-CM | POA: Diagnosis not present

## 2021-05-17 DIAGNOSIS — E1122 Type 2 diabetes mellitus with diabetic chronic kidney disease: Secondary | ICD-10-CM

## 2021-05-17 DIAGNOSIS — I130 Hypertensive heart and chronic kidney disease with heart failure and stage 1 through stage 4 chronic kidney disease, or unspecified chronic kidney disease: Secondary | ICD-10-CM

## 2021-05-17 DIAGNOSIS — I10 Essential (primary) hypertension: Secondary | ICD-10-CM

## 2021-05-17 DIAGNOSIS — N1831 Chronic kidney disease, stage 3a: Secondary | ICD-10-CM

## 2021-05-17 DIAGNOSIS — F17200 Nicotine dependence, unspecified, uncomplicated: Secondary | ICD-10-CM | POA: Insufficient documentation

## 2021-05-17 HISTORY — DX: Liver disease, unspecified: K76.9

## 2021-05-17 MED ORDER — CARVEDILOL 6.25 MG PO TABS: 6.2500 mg | ORAL_TABLET | Freq: Two times a day (BID) | ORAL | 1 refills | Status: DC

## 2021-05-17 MED ORDER — FUROSEMIDE 20 MG PO TABS: 20.0000 mg | ORAL_TABLET | Freq: Every day | ORAL | 0 refills | Status: DC

## 2021-05-17 NOTE — Assessment & Plan Note (Signed)
Patient was found to have new heart failure with reduced ejection fraction during recent hospitalization.  Echo showed an EF of 40 to 45% with global hypokinesis.  Cardiology was consulted, believe decreased ejection fraction is secondary to uncontrolled hypertension.  Recommended optimization of blood pressure and initiation of guideline directed medical therapy with plan to repeat echo in 3 to 4 months to reevaluate ejection fraction.  Per cardiology, plan to perform ischemic work-up if ejection fraction has not improved at that time.  Currently, patient is on Coreg 3.125 mg twice daily and Lasix 20 mg daily.  Wilder Glade was started in the hospital, but patient was unable to obtain it from pharmacy due to insurance issues.  He does have 2+ pitting edema to the mid shins bilaterally on exam today.  Discussed plan to increase Coreg to 6.25 mg twice daily given elevated blood pressure and plan to increase Lasix to 20 mg twice daily given continued edema.  Patient was able to get his insurance figured out and will attempt to get his Wilder Glade from the pharmacy.  Discussed with patient to inform [facility] should there be any difficulties in obtaining [place].  Unfortunately given patient's kidney dysfunction, unable to add ACEI/ARB, Entresto, or spironolactone at this time for further guideline directed medical therapy.  We will obtain CMP today to see if there has been an improvement in his kidney function.  Plan: -Increased Coreg to 6.25 mg twice daily -Increase Lasix to 20 mg twice daily x2 weeks -Start Farxiga -Follow-up CMP, add other GDMT if kidney function allows -Repeat echo in 3 to 4 months, if EF remains low then refer to cardiology for ischemic work-up

## 2021-05-17 NOTE — Assessment & Plan Note (Signed)
Patient found to have kidney dysfunction during recent hospitalization.  Unsure if patient has acute kidney injury secondary to hypertensive emergency or if patient has chronic kidney disease stage III secondary to uncontrolled diabetes and hypertension.  GFR 5 months ago was 54, but GFR 30s to low 40s during recent hospitalization.  Renal ultrasound during hospitalization was normal.  Patient does have mild proteinuria and an elevated microalbumin to creatinine ratio, which is commonly seen with diabetic kidney disease.  Given kidney dysfunction, patient is unable to start Entresto, ACEI/ARB, or spironolactone for concomitant heart failure with reduced ejection fraction.  We will obtain CMP today to evaluate kidney function.   Plan: -Follow-up CMP -If kidney function permits, consider initiating further GDMT -Start Farxiga

## 2021-05-17 NOTE — Assessment & Plan Note (Signed)
Patient was found to have hepatocellular injury with AST 120 and ALT 87 during hospitalization.  Repeat liver test showed AST 75 and ALT 86.  Hepatocellular injury likely secondary to hypertensive emergency.  Patient's ASCVD risk is 44.5%.  However, statin therapy was not initiated given hepatocellular injury.  We will repeat CMP today.  Should liver enzymes continue to trend down/normalize, will initiate statin therapy for primary prevention.

## 2021-05-17 NOTE — Patient Instructions (Signed)
Jason Mccarty,  It was a pleasure seeing you in the clinic today.   We discussed the following today: We increased your Carvedilol to 6.25mg  twice daily. I have sent the new prescription to your pharmacy. Please make sure to keep a blood pressure log at home (check twice daily). You can get a blood pressure log from any local pharmacy [facility], [facility], etc). Please make sure to pick up your Farxiga. If you have any difficulties in obtaining it, please call our clinic and let us know. We will be increasing your Furosemide dose (water pill) to 20mg  twice daily for the next two weeks. Please do your best to incorporate a healthier diet and exercise into your lifestyle. I have added a pamphlet of a good diet to help better control your blood pressure. Please come back to see [place] in 2 weeks.  Please call our clinic at [phone] if you have any questions or concerns. The best time to call is Monday-Friday from 9am-4pm, but there is someone available 24/7 at the same number. If you need medication refills, please notify your pharmacy one week in advance and they will send [place] a request.   Thank you for letting us take part in your care. We look forward to seeing you next time!

## 2021-05-17 NOTE — Assessment & Plan Note (Addendum)
Patient with new diagnosis of type 2 diabetes after recent hospitalization.  Hemoglobin A1c found to be 7.3% with elevated microalbumin to creatinine ratio.  He was started on Farxiga during hospitalization for better glycemic control and for cardio and renal protective effects.  Unfortunately, patient was unable to obtain [place] in the outpatient setting due to insurance issues.  However, he states that he figured it out and will reattempt to get Wilder Glade from his pharmacy.  Emphasized importance of contacting [facility] should he have any difficulties in obtaining [place].  Of note, ASCVD risk of 44.5%.  Plan: -Start Farxiga -Repeat A1c in 3 months

## 2021-05-17 NOTE — Assessment & Plan Note (Signed)
Patient with extensive history of cigarette smoking.  He is trying to quit especially after recent hospitalization.  Discussed at length the importance of tobacco cessation.  States that he would like to quit on his own and that he has only smoked 2 cigarettes since hospital discharge a few days ago.  Encouraged reaching out to [facility] for resources or pharmacological therapies to help with smoking cessation if needed.  Plan: -Continue encouraging smoking cessation at subsequent visits

## 2021-05-17 NOTE — Assessment & Plan Note (Addendum)
Jason Mccarty is a new patient here for hospital follow-up and to establish PCP after recent hospitalization.  He has not previously had a PCP in the past.  During recent hospitalization, patient presented with acute shortness of breath and was found to have systolic blood pressure in the 300s per EMS.  On arrival to the hospital, blood pressures were in the 200s and subsequently improved with nitro.  Chest x-ray at the time showed likely flash pulmonary edema.  Presentation consistent with likely hypertensive emergency.  Blood pressure was gradually brought down with addition of Coreg 3.125 mg twice daily, Norvasc 10 mg daily, and lasix.  On day of discharge, blood pressures remained above goal, ranging in the 960A to 540J systolics.  In the clinic today, patient's blood pressure is 156/78.  He remains above goal.  Denies any acute changes in vision, chest pain, palpitations, new shortness of breath.  Overall, he states that he feels much better since his hospitalization.  We discussed in detail about importance of controlling his hypertension, both with pharmacological therapy and with lifestyle modifications.  He understands that he needs to exercise more and to improve his diet.  Discussed exercising greater than 150 minutes each week and also provided a pamphlet on DASH diet.  Discussed with patient that we will need to increase his carvedilol to 6.25 mg twice daily given that his blood pressure is still above goal.  Also discussed with patient to keep a blood pressure log at home.  He is in agreement with this.  Will have patient follow-up in 2 weeks to reassess blood pressure and adjust medications as needed.  Plan: -DASH diet, moderate intensity exercise -Continue Norvasc 10 mg daily -Increased Coreg to 6.25 mg twice daily -Increased Lasix to 20 mg twice daily (see heart failure tab) -Keep blood pressure log at home -Follow-up in 2 weeks

## 2021-05-17 NOTE — Progress Notes (Signed)
CC: hospital f/u, new to establish PCP  HPI:  [name] is a 61 y.o. male with history listed below presenting to the [facility] for hospital follow up, new to establish PCP. Please see individualized problem based charting for full HPI.  Past Medical History:  Diagnosis Date   Heart failure with reduced ejection fraction (HCC)    Hypertension    Obesity, Class III, BMI 40-49.9 (morbid obesity) ([place])    Sleep apnea    Type 2 diabetes mellitus ([place])    Past Surgical History:  Procedure Laterality Date   HERNIA REPAIR     Family History  Problem Relation Age of Onset   Heart failure Father    Hypertension Father    Social History: -Lives with girlfriend -works as a Presenter, broadcasting -independent in ADLs and iADLs -about 40-pack-year history of tobacco use, attempting to cut back on his own (now about a pack per week) -occasional alcohol use -denies illicit drug usage  Allergies  Allergen Reactions   Benadryl [Diphenhydramine] Itching   Current Outpatient Medications on File Prior to Visit  Medication Sig Dispense Refill   amLODipine (NORVASC) 10 MG tablet Take 1 tablet (10 mg total) by mouth daily. 30 tablet 0   aspirin EC 81 MG tablet Take 81 mg by mouth. Once a week     carvedilol (COREG) 3.125 MG tablet Take 1 tablet (3.125 mg total) by mouth 2 (two) times daily with a meal. 60 tablet 0   dapagliflozin propanediol (FARXIGA) 5 MG TABS tablet Take 1 tablet (5 mg total) by mouth daily before breakfast. 30 tablet 0   furosemide (LASIX) 20 MG tablet Take 1 tablet (20 mg total) by mouth daily. 30 tablet 0   No current facility-administered medications on file prior to visit.     Review of Systems:  Negative aside from that listed in individualized problem based charting.  Physical Exam:  Vitals:   05/17/21 1554  BP: (!) 156/78  Pulse: 70  SpO2: 97%  Weight: (!) 306 lb 1.6 oz (138.8 kg)   Physical Exam Constitutional:      Appearance: He is obese. He is not  ill-appearing.  HENT:     Mouth/Throat:     Mouth: Mucous membranes are moist.     Pharynx: Oropharynx is clear. No oropharyngeal exudate.  Eyes:     General: No scleral icterus.    Extraocular Movements: Extraocular movements intact.     Conjunctiva/sclera: Conjunctivae normal.     Pupils: Pupils are equal, round, and reactive to light.  Cardiovascular:     Rate and Rhythm: Normal rate and regular rhythm.     Pulses: Normal pulses.     Heart sounds: Normal heart sounds. No murmur heard.   No friction rub.  Pulmonary:     Effort: Pulmonary effort is normal.     Breath sounds: Normal breath sounds. No wheezing, rhonchi or rales.  Abdominal:     General: Bowel sounds are normal. There is no distension.     Palpations: Abdomen is soft.     Tenderness: There is no abdominal tenderness.  Musculoskeletal:        General: Normal range of motion.     Cervical back: Normal range of motion.     Comments: 2+ pitting edema bilaterally to mid-shins  Skin:    General: Skin is warm and dry.  Neurological:     General: No focal deficit present.     Mental Status: He is alert and oriented  to person, place, and time.     Motor: No weakness.  Psychiatric:        Mood and Affect: Mood normal.        Behavior: Behavior normal.     Assessment & Plan:   See Encounters Tab for problem based charting.  Patient discussed with Dr. Daryll Drown

## 2021-05-18 LAB — CMP14 + ANION GAP
ALT: 29 IU/L (ref 0–44)
AST: 23 IU/L (ref 0–40)
Albumin/Globulin Ratio: 1.3 (ref 1.2–2.2)
Albumin: 4.1 g/dL (ref 3.8–4.8)
Alkaline Phosphatase: 102 IU/L (ref 44–121)
Anion Gap: 16 mmol/L (ref 10.0–18.0)
BUN/Creatinine Ratio: 13 (ref 10–24)
BUN: 21 mg/dL (ref 8–27)
Bilirubin Total: 0.4 mg/dL (ref 0.0–1.2)
CO2: 26 mmol/L (ref 20–29)
Calcium: 9.8 mg/dL (ref 8.6–10.2)
Chloride: 96 mmol/L (ref 96–106)
Creatinine, Ser: 1.63 mg/dL — ABNORMAL HIGH (ref 0.76–1.27)
Globulin, Total: 3.2 g/dL (ref 1.5–4.5)
Glucose: 128 mg/dL — ABNORMAL HIGH (ref 70–99)
Potassium: 3.9 mmol/L (ref 3.5–5.2)
Sodium: 138 mmol/L (ref 134–144)
Total Protein: 7.3 g/dL (ref 6.0–8.5)
eGFR: 48 mL/min/{1.73_m2} — ABNORMAL LOW (ref >59)

## 2021-05-18 NOTE — Progress Notes (Signed)
Internal Medicine Clinic Attending  Case discussed with Dr. Jinwala at the time of the visit.  We reviewed the resident's history and exam and pertinent patient test results.  I agree with the assessment, diagnosis, and plan of care documented in the resident's note.  

## 2021-05-21 ENCOUNTER — Telehealth: Payer: Self-pay | Admitting: *Deleted

## 2021-05-21 ENCOUNTER — Other Ambulatory Visit (HOSPITAL_COMMUNITY): Payer: Self-pay

## 2021-05-21 DIAGNOSIS — K769 Liver disease, unspecified: Secondary | ICD-10-CM

## 2021-05-21 DIAGNOSIS — E1122 Type 2 diabetes mellitus with diabetic chronic kidney disease: Secondary | ICD-10-CM

## 2021-05-21 NOTE — Telephone Encounter (Signed)
Patient called in stating he has not had "any sugar medicine since I left the hospital." [facility] at [facility]. States it is not covered by insurance. It is not asking for a PA. Will route to PCP and Pharm Team.

## 2021-05-23 MED ORDER — METFORMIN HCL 500 MG PO TABS: 500.0000 mg | ORAL_TABLET | Freq: Every day | ORAL | 2 refills | Status: DC

## 2021-05-23 NOTE — Assessment & Plan Note (Signed)
Discussed that his liver enzymes have returned to within normal limits and thus recommended initiation of statin therapy for primary prevention (ASCVD risk 44.5%). He would like to hold off on this until next office visit (06/04/2021) so that he is not starting two medications at once. I think this is appropriate.

## 2021-05-23 NOTE — Assessment & Plan Note (Addendum)
Unfortunately, his insurance will not cover any SGLT-2i therapies at this time, likely because he has not been trialed on metformin. His eGFR is 48 on recent labs, so will start patient on low-dose metformin. Discussed this with patient and he is in agreement.  -ordered metformin 500mg  daily, can titrate up as needed and as tolerated -consider SGLT-2i therapy if unable to tolerate metformin

## 2021-05-23 NOTE — Telephone Encounter (Signed)
Patient with history of T2DM (recent A1c 7.3%), not on any medications. During last office visit, discussed initiation of farxiga given concomitant comorbidities. Unfortunately, his insurance will not cover any SGLT-2i therapies at this time, likely because he has not been trialed on metformin. His eGFR is 48 on recent labs, so will start patient on low-dose metformin. Discussed this with patient and he is in agreement.  Also, discussed that his liver enzymes have returned to within normal limits and thus recommended initiation of statin therapy for primary prevention (ASCVD risk 44.5%). He would like to hold off on this until next office visit (06/04/2021) so that he is not starting two medications at once. I think this is appropriate.    Plan: -ordered metformin 500mg  daily, can titrate up as needed and as tolerated -consider SGLT-2i therapy if unable to tolerate metformin -will discuss initiation of statin therapy at next office visit on 06/04/2021

## 2021-05-28 ENCOUNTER — Other Ambulatory Visit: Payer: Self-pay | Admitting: Student

## 2021-05-28 DIAGNOSIS — E1122 Type 2 diabetes mellitus with diabetic chronic kidney disease: Secondary | ICD-10-CM

## 2021-05-28 DIAGNOSIS — N1831 Chronic kidney disease, stage 3a: Secondary | ICD-10-CM

## 2021-05-28 NOTE — Telephone Encounter (Signed)
Needs two prescriptions one for a mer and one for strips [facility] [place]

## 2021-05-28 NOTE — Telephone Encounter (Signed)
Refill Request  Pt requesting a call back about a voucher he has for a Meter and Test Strips. Pt unsure of the name of the meter and strips he needs.

## 2021-05-29 MED ORDER — GLUCOSE BLOOD VI STRP: ORAL_STRIP | 11 refills | Status: DC

## 2021-05-29 MED ORDER — BLOOD GLUCOSE MONITOR KIT: PACK | 0 refills | Status: DC

## 2021-06-01 ENCOUNTER — Other Ambulatory Visit: Payer: Self-pay | Admitting: Internal Medicine

## 2021-06-04 ENCOUNTER — Encounter: Payer: Self-pay | Admitting: Student

## 2021-06-04 ENCOUNTER — Ambulatory Visit (INDEPENDENT_AMBULATORY_CARE_PROVIDER_SITE_OTHER): Payer: Commercial Managed Care - PPO | Admitting: Student

## 2021-06-04 VITALS — BP 137/70 | HR 65 | Temp 98.1°F | Ht 71.0 in | Wt 300.7 lb

## 2021-06-04 DIAGNOSIS — N1831 Chronic kidney disease, stage 3a: Secondary | ICD-10-CM

## 2021-06-04 DIAGNOSIS — I130 Hypertensive heart and chronic kidney disease with heart failure and stage 1 through stage 4 chronic kidney disease, or unspecified chronic kidney disease: Secondary | ICD-10-CM

## 2021-06-04 DIAGNOSIS — N179 Acute kidney failure, unspecified: Secondary | ICD-10-CM

## 2021-06-04 DIAGNOSIS — I509 Heart failure, unspecified: Secondary | ICD-10-CM

## 2021-06-04 DIAGNOSIS — I10 Essential (primary) hypertension: Secondary | ICD-10-CM

## 2021-06-04 DIAGNOSIS — N183 Chronic kidney disease, stage 3 unspecified: Secondary | ICD-10-CM

## 2021-06-04 DIAGNOSIS — E1122 Type 2 diabetes mellitus with diabetic chronic kidney disease: Secondary | ICD-10-CM

## 2021-06-04 MED ORDER — AMLODIPINE BESYLATE 10 MG PO TABS: 10.0000 mg | ORAL_TABLET | Freq: Every day | ORAL | 0 refills | Status: DC

## 2021-06-04 MED ORDER — METFORMIN HCL 500 MG PO TABS: 500.0000 mg | ORAL_TABLET | Freq: Every day | ORAL | 2 refills | Status: DC

## 2021-06-04 MED ORDER — DAPAGLIFLOZIN PROPANEDIOL 5 MG PO TABS: 5.0000 mg | ORAL_TABLET | Freq: Every day | ORAL | 1 refills | Status: DC

## 2021-06-04 MED ORDER — ATORVASTATIN CALCIUM 40 MG PO TABS: 40.0000 mg | ORAL_TABLET | Freq: Every day | ORAL | 1 refills | Status: DC

## 2021-06-04 MED ORDER — FUROSEMIDE 20 MG PO TABS: 20.0000 mg | ORAL_TABLET | Freq: Every day | ORAL | 0 refills | Status: DC

## 2021-06-04 MED ORDER — CARVEDILOL 6.25 MG PO TABS: 6.2500 mg | ORAL_TABLET | Freq: Two times a day (BID) | ORAL | 1 refills | Status: DC

## 2021-06-04 NOTE — Assessment & Plan Note (Addendum)
Patient with newly diagnosed heart failure with reduced ejection fraction during recent hospitalization.  Echo with EF of 40 to 45% with global hypokinesis.  Please see prior note from me on 05/17/2021 for complete details.  Patient has been taking Lasix 40 mg daily as advised during last visit.  He does continue to have 1+ pitting edema bilaterally up to mid shins.  He may require Lasix 40 mg daily as maintenance dose.  However given that he has had a 6 pound weight loss since last visit 2 weeks ago, will trial Lasix 40 mg for another 2 weeks to see if he becomes euvolemic.  Unfortunately, patient was unable to pick up [place] after last visit as it was not covered with insurance.  Unclear as to why Wilder Glade is not covered given that patient does have type 2 diabetes and concomitant systolic heart failure and chronic kidney disease.  I will try to prescribe Wilder Glade once more for added cardio and renal protective effects.  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  We will recheck BMP today to check for continued improvement.  Holding off on initiation of ACEI/ARB, Entresto, spironolactone due to kidney dysfunction.  Plan: -Continue Coreg 6.25 mg twice daily -Start Farxiga 5 mg daily -Continue Lasix 20 mg twice daily for 2 weeks -Follow-up BMP, add GDMT if kidney function allows -Repeat echo in about 3 months, if EF remains low then refer to cardiology for ischemic work-up

## 2021-06-04 NOTE — Patient Instructions (Signed)
Jason Mccarty,  It was a pleasure seeing you in the clinic today.   I have placed refills for your medications. Please start taking Atorvastatin (lipitor) for your cholesterol and heart health. Please start taking Dapagliflozin (farxiga) for your sugars and for your heart. We are checking your electrolytes and kidney function today. Please come back in 2 weeks for follow up.  Please call our clinic at [phone] if you have any questions or concerns. The best time to call is Monday-Friday from 9am-4pm, but there is someone available 24/7 at the same number. If you need medication refills, please notify your pharmacy one week in advance and they will send [place] a request.   Thank you for letting us take part in your care. We look forward to seeing you next time!

## 2021-06-04 NOTE — Progress Notes (Signed)
° °  CC: Follow-up of hypertension and type 2 diabetes  HPI:  Jason Mccarty is a 61 y.o. male with history listed below presenting to the [facility] for follow-up of hypertension and type 2 diabetes. Please see individualized problem based charting for full HPI.  Past Medical History:  Diagnosis Date   Acute respiratory failure with hypercapnia ([place]) 05/11/2021   Heart failure with reduced ejection fraction (HCC)    Hypertension    Obesity, Class III, BMI 40-49.9 (morbid obesity) ([place])    Sleep apnea    Type 2 diabetes mellitus ([place])     Review of Systems:  Negative aside from that listed in individualized problem based charting.  Physical Exam:  Vitals:   06/04/21 1536  BP: 137/70  Pulse: 65  Temp: 98.1 F (36.7 C)  TempSrc: Oral  SpO2: 98%  Weight: (!) 300 lb 11.2 oz (136.4 kg)  Height: 5\' 11"  (1.803 m)   Physical Exam Constitutional:      Appearance: He is obese. He is not ill-appearing.  Eyes:     Extraocular Movements: Extraocular movements intact.     Conjunctiva/sclera: Conjunctivae normal.     Pupils: Pupils are equal, round, and reactive to light.  Cardiovascular:     Rate and Rhythm: Normal rate and regular rhythm.     Pulses: Normal pulses.  Pulmonary:     Effort: Pulmonary effort is normal.     Breath sounds: Normal breath sounds. No wheezing, rhonchi or rales.  Abdominal:     General: Bowel sounds are normal. There is no distension.     Palpations: Abdomen is soft.     Tenderness: There is no abdominal tenderness.  Musculoskeletal:        General: Normal range of motion.     Comments: 1+ pitting edema up to mid shins bilaterally  Skin:    General: Skin is warm and dry.  Neurological:     General: No focal deficit present.     Mental Status: He is alert and oriented to person, place, and time.  Psychiatric:        Mood and Affect: Mood normal.        Behavior: Behavior normal.     Assessment & Plan:   See Encounters Tab for problem based  charting.  Patient discussed with Dr.  Cain Sieve

## 2021-06-04 NOTE — Assessment & Plan Note (Addendum)
Patient with history of type 2 diabetes, recent A1c of 7.3% in January 2023.  Attempted to start patient on Farxiga during last visit but he was unable to afford it due to insurance not covering it.  Instead, started him on metformin 500 mg daily.  He has not been checking his blood glucose levels at home.  Emphasized importance of doing so.  Will reorder Wilder Glade once more in hopes that insurance will cover it given that patient has type 2 diabetes along with concomitant systolic heart failure and chronic kidney disease.  Of note, patient with an ASCVD risk of 44.5%.  He was previously not started on statin therapy due to hepatocellular injury which has since resolved.  Will initiate statin therapy with Lipitor 40 mg daily for primary prevention.  Plan: -Continue metformin 500 mg daily -Start Farxiga 5 mg daily -Start Lipitor 40 mg daily -Repeat A1c in 2 months

## 2021-06-04 NOTE — Assessment & Plan Note (Signed)
Patient with history of hypertension, recently hospitalized for hypertensive emergency.  He is doing well on Norvasc 10 mg daily, Coreg 6.25 mg twice daily, and Lasix 20 mg twice daily.  Blood pressure today is 137/70.  He did bring in a blood pressure log showing that his blood pressures are ranging from the 314C to 767W systolics.  His pulse is low normal at this time so will not further uptitrate Coreg.  I am hopeful that addition of Wilder Glade will provide some mild antihypertensive benefit and bring blood pressure to goal.  Plan: -Continue Norvasc 10 mg daily, Coreg 6.25 mg twice daily and Lasix 20 mg twice daily -Start Farxiga 5 mg daily -Keep blood pressure log at home -Follow-up in 2 weeks

## 2021-06-04 NOTE — Assessment & Plan Note (Signed)
Patient's kidney function and [place] attributed to AKI, but appears to likely have some chronicity behind it.  Repeat CMP at hospital follow-up showing GFR in the CKD 3B range.  We will repeat a BMP again today to check for continued improvement of renal function.  Hopeful that if kidney function continues to improve, can initiate further guideline directed medical therapy for concomitant heart failure with reduced ejection fraction (Entresto, ACE inhibitor/ARB, spironolactone).  Plan: -Follow-up BMP -Start Wilder Glade

## 2021-06-05 LAB — BMP8+ANION GAP
Anion Gap: 23 mmol/L — ABNORMAL HIGH (ref 10.0–18.0)
BUN/Creatinine Ratio: 17 (ref 10–24)
BUN: 23 mg/dL (ref 8–27)
CO2: 21 mmol/L (ref 20–29)
Calcium: 9.2 mg/dL (ref 8.6–10.2)
Chloride: 100 mmol/L (ref 96–106)
Creatinine, Ser: 1.37 mg/dL — ABNORMAL HIGH (ref 0.76–1.27)
Glucose: 87 mg/dL (ref 70–99)
Potassium: 4.3 mmol/L (ref 3.5–5.2)
Sodium: 144 mmol/L (ref 134–144)
eGFR: 59 mL/min/{1.73_m2} — ABNORMAL LOW (ref >59)

## 2021-06-05 NOTE — Progress Notes (Signed)
Patient called x2.  Unable to reach patient. Kidney function improving. Isolated anion gap with normal glucose and bicarbonate levels, likely lab error.

## 2021-06-05 NOTE — Progress Notes (Signed)
Internal Medicine Clinic Attending  Case discussed with Dr. Allyson Sabal  At the time of the visit.  We reviewed the residents history and exam and pertinent patient test results.  I agree with the assessment, diagnosis, and plan of care documented in the residents note.   Acute kidney injury has resolved, so we will start statin today. He is at high risk for cardiovascular events, so I think statin will be important for him.   I'd like to get him on full GDMT (adding ACE/ARB or Entresto), so if kidney function remains stable at next visit, can consider adding this.  Start SGLT2 today for T2DM and HFrEF

## 2021-06-13 ENCOUNTER — Other Ambulatory Visit: Payer: Self-pay | Admitting: Student

## 2021-06-13 DIAGNOSIS — E1122 Type 2 diabetes mellitus with diabetic chronic kidney disease: Secondary | ICD-10-CM

## 2021-06-13 DIAGNOSIS — N1831 Chronic kidney disease, stage 3a: Secondary | ICD-10-CM

## 2021-06-13 NOTE — Telephone Encounter (Signed)
Refill Request-  Pt was instructed to call his PCP.  Pt states he is unable to get any of his medications refilled and needs for someone to call him back to explain why. ? ? ? ? ?amLODipine (NORVASC) 10 MG tablet ?aspirin EC 81 MG tablet ?atorvastatin (LIPITOR) 40 MG tablet ?blood glucose meter kit and supplies KIT ?carvedilol (COREG) 6.25 MG tablet ?dapagliflozin propanediol (FARXIGA) 5 MG TABS tablet ?furosemide (LASIX) 20 MG tablet ?glucose blood test strip ?metFORMIN (GLUCOPHAGE) 500 MG tablet ? ? ?[id number](Ph: [phone] ?

## 2021-06-13 NOTE — Telephone Encounter (Signed)
Return pt's call - no answer, left message to call the office. [facility] lunch at [facility]. ?

## 2021-06-13 NOTE — Telephone Encounter (Signed)
[facility] a [facility] approving his medications.\ ? ?I talked to pt who stated he was on the telephone with his insurance co, [facility], who told him to send his rxs to [facility] and they will be covered.Next appt scheduled 06/18/21 with PCP. ?

## 2021-06-14 MED ORDER — FUROSEMIDE 20 MG PO TABS: 20.0000 mg | ORAL_TABLET | Freq: Every day | ORAL | 0 refills | Status: DC

## 2021-06-14 MED ORDER — METFORMIN HCL 500 MG PO TABS: 500.0000 mg | ORAL_TABLET | Freq: Every day | ORAL | 2 refills | Status: DC

## 2021-06-14 MED ORDER — BLOOD GLUCOSE MONITOR KIT: PACK | 0 refills | Status: DC

## 2021-06-14 MED ORDER — GLUCOSE BLOOD VI STRP: ORAL_STRIP | 11 refills | Status: DC

## 2021-06-14 MED ORDER — AMLODIPINE BESYLATE 10 MG PO TABS: 10.0000 mg | ORAL_TABLET | Freq: Every day | ORAL | 0 refills | Status: DC

## 2021-06-14 MED ORDER — CARVEDILOL 6.25 MG PO TABS: 6.2500 mg | ORAL_TABLET | Freq: Two times a day (BID) | ORAL | 1 refills | Status: DC

## 2021-06-14 MED ORDER — ATORVASTATIN CALCIUM 40 MG PO TABS: 40.0000 mg | ORAL_TABLET | Freq: Every day | ORAL | 1 refills | Status: DC

## 2021-06-14 MED ORDER — DAPAGLIFLOZIN PROPANEDIOL 5 MG PO TABS: 5.0000 mg | ORAL_TABLET | Freq: Every day | ORAL | 1 refills | Status: DC

## 2021-06-14 MED ORDER — ASPIRIN EC 81 MG PO TBEC: 81.0000 mg | DELAYED_RELEASE_TABLET | Freq: Every day | ORAL | 3 refills | Status: DC

## 2021-06-18 ENCOUNTER — Other Ambulatory Visit: Payer: Self-pay

## 2021-06-18 ENCOUNTER — Encounter: Payer: Self-pay | Admitting: Internal Medicine

## 2021-06-18 ENCOUNTER — Ambulatory Visit (INDEPENDENT_AMBULATORY_CARE_PROVIDER_SITE_OTHER): Payer: Commercial Managed Care - PPO | Admitting: Internal Medicine

## 2021-06-18 DIAGNOSIS — I5022 Chronic systolic (congestive) heart failure: Secondary | ICD-10-CM

## 2021-06-18 DIAGNOSIS — N529 Male erectile dysfunction, unspecified: Secondary | ICD-10-CM

## 2021-06-18 MED ORDER — SILDENAFIL CITRATE 50 MG PO TABS: 50.0000 mg | ORAL_TABLET | ORAL | 0 refills | Status: DC | PRN

## 2021-06-18 NOTE — Patient Instructions (Signed)
It was nice seeing you today! Thank you for choosing [facility] for your Primary Care.  ?  ?Today we talked about:  ? ?For now, continue your current medications, that includes Furosemide, Carvedilol and Amlodipine.  ?Please let me know if these medications need to be sent to another pharmacy ?Once we can make sure you medications are covered, please pick up and start Farxiga (Dapagliflozin) and Atorvastatin  ? ?I have sent in a prescription for Viagra to [facility] on [address].  ? ? ? ?Follow up 2 weeks after starting new medications.  ?

## 2021-06-19 DIAGNOSIS — N529 Male erectile dysfunction, unspecified: Secondary | ICD-10-CM | POA: Insufficient documentation

## 2021-06-19 NOTE — Assessment & Plan Note (Addendum)
Jason Mccarty was diagnosed with heart failure with moderately reduced EF in January 2023 after presenting with pulmonary edema.  TTE demonstrates EF of 40 to 45% with regional wall motion abnormalities and severe left ventricular hypertrophy.  Suspected ischemic cardiomyopathy. ? ?At recent follow-up, he was continued on Lasix and Coreg.  Due to persistent hypervolemia, he was started on Farxiga.  Jason Mccarty tells me today that his insurance does not cover any of his medications and he was only able to afford the furosemide and Coreg out-of-pocket.  He has been taking these as instructed.  He denies any orthopnea or shortness of breath but has noticed some swelling in his legs.  He does not understand why his weight today is up 2 pounds compared to his last visit although his abdominal girth has gone down significantly given he has moved to notches in to his belt loop. ? ?Assessment/plan: ?Mildly hypervolemic on examination today with +1 pitting edema of the bilateral lower extremities, however no evidence of pulmonary edema.  I recommended that Jason Mccarty reach out to his insurance as well as pharmacy to get an idea of why it is not covering his medications.  Instructed that once he is able to find out, to please give [place] a call back and let us know if we need to send the medication elsewhere.  Recommended he start the [place] as soon as he is able to obtain it.  Recommended follow-up 2 weeks after starting Farxiga. ? ?Unable to titrate Coreg due to bradycardia today. ? ?Recent BMP demonstrates renal improvement.  At this time, I would recommend starting an ARB however I do not want to start 2 new medications at the same time and patient is already having difficulty obtaining [place].  Jason Mccarty will contact the clinic once he speaks with his insurance company. ? ?- Continue Lasix and Coreg ?- Start Wilder Glade if patient is able to get insurance coverage ?- Would recommend initiating ARB at next visit ?- 2-week follow  up ?

## 2021-06-19 NOTE — Assessment & Plan Note (Signed)
Mr. Depree states that he has difficulty both obtaining and maintaining an erection.  Due to this, he has been experiencing distress.  He is requesting Viagra. ? 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  Will prescribe short course of Viagra and counseled extensively side effects to watch out for. ? ?-Viagra 50 mg, take 1 hour before sexual activity ?

## 2021-06-19 NOTE — Progress Notes (Signed)
? ?  CC: HFrEF follow up ? ?HPI: ? ?[name] is a 61 y.o. with a PMHx as listed below who presents to the clinic for HFrEF follow up.  ? ?Please see the Encounters tab for problem-based Assessment & Plan regarding status of patient's acute and chronic conditions. ? ?Past Medical History:  ?Diagnosis Date  ? Acute respiratory failure with hypercapnia ([place]) 05/11/2021  ? Heart failure with reduced ejection fraction ([place])   ? 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 Hypertension   ? Obesity, Class III, BMI 40-49.9 (morbid obesity) ([place])   ? Sleep apnea   ? Type 2 diabetes mellitus ([place])   ? ?Review of Systems: Review of Systems  ?Constitutional:  Negative for chills, fever and weight loss.  ?Respiratory:  Negative for shortness of breath.   ?Cardiovascular:  Positive for leg swelling. Negative for chest pain, palpitations and orthopnea.  ?Gastrointestinal:  Negative for abdominal pain, diarrhea, nausea and vomiting.  ?Neurological:  Negative for dizziness and headaches.  ? ?Physical Exam: ? ?Vitals:  ? 06/18/21 1544  ?BP: (!) 142/73  ?Pulse: 68  ?Resp: (!) 24  ?Temp: 98.3 ?F (36.8 ?C)  ?TempSrc: Oral  ?SpO2: 98%  ?Weight: (!) 301 lb 11.2 oz (136.9 kg)  ?Height: '5\' 11"'$  (1.803 m)  ? ?Physical Exam ?Vitals and nursing note reviewed.  ?Constitutional:   ?   Appearance: He is obese.  ?HENT:  ?   Head: Normocephalic and atraumatic.  ?Cardiovascular:  ?   Rate and Rhythm: Normal rate and regular rhythm.  ?   Heart sounds: No murmur heard. ?Pulmonary:  ?   Effort: Pulmonary effort is normal. No respiratory distress.  ?   Breath sounds: Normal breath sounds. No wheezing, rhonchi or rales.  ?Abdominal:  ?   General: Bowel sounds are normal.  ?   Palpations: Abdomen is soft.  ?Musculoskeletal:  ?   Right lower leg: Edema (+1 pitting) present.  ?   Left lower leg: Edema (+1 pitting) present.  ?Skin: ?   General: Skin is warm and dry.  ?Neurological:  ?   General: No focal deficit present.  ?   Mental Status: He is alert and oriented  to person, place, and time. Mental status is at baseline.  ?Psychiatric:     ?   Mood and Affect: Mood normal.     ?   Behavior: Behavior normal.  ? ? ?Assessment & Plan:  ? ?See Encounters Tab for problem based charting. ? ?Patient discussed with Dr.  Cain Sieve ? ?

## 2021-06-20 NOTE — Progress Notes (Signed)
Internal Medicine Clinic Attending  Case discussed with Dr. Basaraba  At the time of the visit.  We reviewed the resident's history and exam and pertinent patient test results.  I agree with the assessment, diagnosis, and plan of care documented in the resident's note.  

## 2021-06-25 ENCOUNTER — Other Ambulatory Visit: Payer: Self-pay | Admitting: Student

## 2021-06-25 DIAGNOSIS — N1831 Chronic kidney disease, stage 3a: Secondary | ICD-10-CM

## 2021-06-25 DIAGNOSIS — E1122 Type 2 diabetes mellitus with diabetic chronic kidney disease: Secondary | ICD-10-CM

## 2021-06-25 NOTE — Telephone Encounter (Signed)
Refill Request- ? ?Pt states [facility] and will be out of medications.  Pt has been trying to get his meds refilled since 06/13/2021. ? ?The patient would now like his meds sent to  ? ?[facility] ?Address: [address], [place], [place] [address] ? ?Phone: [phone] ? ?Aspirin 81 mg Oral Daily, Once a week  ? ?amLODIPine Besylate 10 mg Oral Daily ? ?Atorvastatin Calcium 40 mg Oral Daily ? ?Carvedilol 6.25 mg Oral 2 times daily with meals ? ?Dapagliflozin Propanediol 5 mg Oral Daily before breakfast ? ?Furosemide 20 mg Oral Daily ? ?metFORMIN HCl 500 mg Oral Daily with breakfast ? ?BLOOD GLUCOSE MONITOR KIT Use up to one time daily as directed. ? ?Glucose Blood Use up to 1 time daily ?

## 2021-06-26 ENCOUNTER — Telehealth: Payer: Self-pay | Admitting: *Deleted

## 2021-06-26 MED ORDER — FUROSEMIDE 20 MG PO TABS: 20.0000 mg | ORAL_TABLET | Freq: Every day | ORAL | 0 refills | Status: DC

## 2021-06-26 MED ORDER — GLUCOSE BLOOD VI STRP: ORAL_STRIP | 11 refills | Status: DC

## 2021-06-26 MED ORDER — BLOOD GLUCOSE MONITOR KIT: PACK | 0 refills | Status: AC

## 2021-06-26 MED ORDER — AMLODIPINE BESYLATE 10 MG PO TABS: 10.0000 mg | ORAL_TABLET | Freq: Every day | ORAL | 0 refills | Status: DC

## 2021-06-26 MED ORDER — ASPIRIN EC 81 MG PO TBEC: 81.0000 mg | DELAYED_RELEASE_TABLET | Freq: Every day | ORAL | 3 refills | Status: DC

## 2021-06-26 MED ORDER — CARVEDILOL 6.25 MG PO TABS: 6.2500 mg | ORAL_TABLET | Freq: Two times a day (BID) | ORAL | 1 refills | Status: DC

## 2021-06-26 MED ORDER — ATORVASTATIN CALCIUM 40 MG PO TABS: 40.0000 mg | ORAL_TABLET | Freq: Every day | ORAL | 1 refills | Status: DC

## 2021-06-26 MED ORDER — METFORMIN HCL 500 MG PO TABS: 500.0000 mg | ORAL_TABLET | Freq: Every day | ORAL | 2 refills | Status: DC

## 2021-06-26 MED ORDER — DAPAGLIFLOZIN PROPANEDIOL 5 MG PO TABS: 5.0000 mg | ORAL_TABLET | Freq: Every day | ORAL | 1 refills | Status: DC

## 2021-06-26 NOTE — Telephone Encounter (Signed)
Call from [facility] need clarification on directions for ASA.   Lancets and strips need a number to dispense.   ?

## 2021-07-05 ENCOUNTER — Telehealth: Payer: Self-pay

## 2021-07-05 NOTE — Telephone Encounter (Signed)
Return pt's call - stated Dr Allyson Sabal needs to call his insurance co so they can pay for his rxs. I asked pt why the doctor needs to call his insurance co - pt stated Dr Allyson Sabal knows why and they have discussed this issue before. ?

## 2021-07-05 NOTE — Telephone Encounter (Signed)
Requesting to speak with a nurse about refill, pt do not know the name of the medications. Please call back.  ?

## 2021-07-19 ENCOUNTER — Other Ambulatory Visit: Payer: Self-pay | Admitting: Internal Medicine

## 2021-07-19 ENCOUNTER — Ambulatory Visit (INDEPENDENT_AMBULATORY_CARE_PROVIDER_SITE_OTHER): Payer: Commercial Managed Care - PPO | Admitting: Internal Medicine

## 2021-07-19 VITALS — BP 143/70 | HR 74 | Temp 98.3°F | Ht 71.0 in | Wt 302.2 lb

## 2021-07-19 DIAGNOSIS — Z1211 Encounter for screening for malignant neoplasm of colon: Secondary | ICD-10-CM | POA: Diagnosis not present

## 2021-07-19 DIAGNOSIS — I110 Hypertensive heart disease with heart failure: Secondary | ICD-10-CM | POA: Diagnosis not present

## 2021-07-19 DIAGNOSIS — I10 Essential (primary) hypertension: Secondary | ICD-10-CM

## 2021-07-19 DIAGNOSIS — I5022 Chronic systolic (congestive) heart failure: Secondary | ICD-10-CM

## 2021-07-19 DIAGNOSIS — N1831 Chronic kidney disease, stage 3a: Secondary | ICD-10-CM

## 2021-07-19 MED ORDER — DAPAGLIFLOZIN PROPANEDIOL 5 MG PO TABS: 5.0000 mg | ORAL_TABLET | Freq: Every day | ORAL | 1 refills | Status: DC

## 2021-07-19 MED ORDER — SPIRONOLACTONE 25 MG PO TABS: 25.0000 mg | ORAL_TABLET | Freq: Every day | ORAL | 11 refills | Status: DC

## 2021-07-19 NOTE — Patient Instructions (Signed)
Mr. Torelli, ? ?It was a pleasure to care for you today. I am sorry that it has been such a struggle getting your medications covered by insurance. I have had my attending physician sign the order for your Farxiga medication which we hope will help. Please let us know, however I suggest that you contact your insurance and find out if your coverage is active and why your medications aren't being covered. ? ?I have added one additional medication for your high blood pressure called spironolactone which you will take once daily. Please call and make an appointment to come to the clinic for 2 weeks after you are able to obtain both the Farxiga and spironolactone medications. ? ?Remember: If you have any questions or concerns, please call our clinic at [phone] between 9am-5pm and after hours call [phone] and ask for the internal medicine resident on call. If you feel you are having a medical emergency please call 911. ? ?Farrel Gordon, DO  ?

## 2021-07-19 NOTE — Progress Notes (Signed)
? ?  CC: f/u for BP, HFrEF ? ?HPI: ? ?[name] is a 60 y.o. male with past medical history as detailed below who presents for follow-up of BP and HFrEF after being prescribed Farxiga at his last visit. Please see problem based charting for detailed assessment and plan. ? ?Past Medical History:  ?Diagnosis Date  ? Acute respiratory failure with hypercapnia ([place]) 05/11/2021  ? Heart failure with reduced ejection fraction ([place])   ? 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 Hypertension   ? Obesity, Class III, BMI 40-49.9 (morbid obesity) ([place])   ? Sleep apnea   ? Type 2 diabetes mellitus ([place])   ? ?Review of Systems:   ?Review of Systems  ?Constitutional:  Negative for weight loss.  ?Respiratory:  Negative for cough.   ?Cardiovascular:  Positive for leg swelling. Negative for chest pain and orthopnea.  ?Gastrointestinal:  Positive for diarrhea. Negative for abdominal pain.  ?Genitourinary:  Positive for frequency. Negative for dysuria.  ?Musculoskeletal:  Negative for myalgias.  ?Neurological:  Negative for weakness and headaches.  ? ? ?Physical Exam: ? ?Vitals:  ? 07/19/21 1535  ?BP: (!) 143/70  ?Pulse: 74  ?Temp: 98.3 ?F (36.8 ?C)  ?TempSrc: Oral  ?SpO2: 100%  ?Weight: (!) 302 lb 3.2 oz (137.1 kg)  ?Height: '5\' 11"'$  (1.803 m)  ? ?Constitutional:No acute distress. ?Cardio:Regular rate and rhythm. No murmurs, rubs, gallops. ?Pulm:Clear to auscultation bilaterally. ?Abdomen:Soft, nontender, nondistended. ?WIO:MBTDHRCBU 1+ LE edema. ?Skin:Warm and dry. ?Neuro:Alert and oriented x3. No focal deficit noted. ?Psych:Patient seems irritated and somewhat impatient. ? ?Assessment & Plan:  ? ?See Encounters Tab for problem based charting. ? ?Patient discussed with Dr. Jimmye Norman ? ?

## 2021-07-24 DIAGNOSIS — Z1211 Encounter for screening for malignant neoplasm of colon: Secondary | ICD-10-CM | POA: Insufficient documentation

## 2021-07-24 NOTE — Progress Notes (Signed)
Internal Medicine Clinic Attending ? ?Case discussed with Dr. Dean  At the time of the visit.  We reviewed the resident?s history and exam and pertinent patient test results.  I agree with the assessment, diagnosis, and plan of care documented in the resident?s note.  ?

## 2021-07-24 NOTE — Assessment & Plan Note (Signed)
Patient has not had screening colonoscopy done before and is due to begin routine screening. At this time insurance coverage may be a barrier as it is unclear if he is covered however in our discussion he is agreeable to a referral and will discuss with the GI office once he is contacted to schedule the procedure what pricing and insurance coverage will be. ?Assessment: Due to start screening colonoscopy. ?Plan: Referral to GI for colonoscopy placed. ?

## 2021-07-24 NOTE — Assessment & Plan Note (Addendum)
Patient's BP continues to be above goal at 143/70 today. He is currently managed with amlodipine 10 mg daily and carvedilol 6.25 mg daily. Carvedilol dose has not been able to be increased as he has had intermittent bradycardia over several visits though he has a normal HR today.  ?Assessment: BP is above goal. ?Plan: Start spironolactone 25 mg daily. Continue to check BP at home and record in home BP log to bring to follow-up visit, which I have advised him to call and make an appointment for 2 weeks after he is able to obtain Farxiga medication. Recheck BMP in 3 months. ?

## 2021-07-24 NOTE — Assessment & Plan Note (Signed)
Patient returns for two-week follow-up. At his last visit bradycardia limited the ability to titrate Coreg. He was also experiencing difficulty obtaining Farxiga at that time due to lack of insurance coverage of the medication. Since that visit he has been told by insurance to have his PCP sign the order and at that time he can have it covered. Our team has spoken further with the pharmacy and they have counseled [place] to have an attending sign the order so that it will be covered.  ? ?Unfortunately I worry, based on apparent lack of coverage of any medications that I try to send in, that he does not actually have insurance coverage at this time. He does have the insurance card however that does not mean his coverage is active. Weight is up 0.2 kg in 1 month's time. He denies worsening of his LE edema, dyspnea on exertion. ? ?Assessment:Mildly hypervolemic on exam today but this does not appear to be changed based on previous visit notes and patient's report re: volume status. I have reiterated the need for the patient to contact his insurance company regarding his coverage to make sure that it is active and to find out why his medications are not being covered at this time. He states that he has already done this and tells me that we have to take steps to get the medications covered further for him; I explained that we are doing what has been requested regarding resigning his prescriptions however it is ultimately his responsibility to contact the insurance company. ?Plan: I have advised that he schedule a follow-up visit for 2 weeks after he is able to obtain [place]. ?

## 2021-07-25 ENCOUNTER — Telehealth: Payer: Self-pay

## 2021-07-25 NOTE — Telephone Encounter (Signed)
Pa for pt Jason Mccarty )  came through on cover  my meds was submitted with office notes and last A1C .. awaiting approval or denial  ?

## 2021-07-27 NOTE — Telephone Encounter (Signed)
UPDATE : ? ? ? ? ?[employer] has not yet replied to your appeal. You may close this dialog, return to your dashboard, and perform other tasks. ?

## 2021-07-27 NOTE — Telephone Encounter (Signed)
DECISION : ? ? ? ?Denied on April 12 ? ? ?Request Reference Number: [id number]. FARXIGA TAB '5MG'$  is denied due to Plan Exclusion.  ? ? ? ? ? ( BUT IT ALSO GAVE THE OPINION TO APPEAL ON LINE   SO WAITING NEW DECISION OR DENIAL )    ?

## 2021-07-30 NOTE — Telephone Encounter (Signed)
DECISION : ? ? ? ? ? ? DENIED : ? ? ?This decision is based on your plan's drug coverage policy for this medication. ? ?Talk with your provider about one of these options: ?1. Switch to another medication that's covered by the plan ?

## 2021-08-16 ENCOUNTER — Other Ambulatory Visit: Payer: Self-pay

## 2021-08-16 ENCOUNTER — Encounter: Payer: Self-pay | Admitting: Internal Medicine

## 2021-08-16 ENCOUNTER — Ambulatory Visit (INDEPENDENT_AMBULATORY_CARE_PROVIDER_SITE_OTHER): Payer: Commercial Managed Care - PPO | Admitting: Internal Medicine

## 2021-08-16 VITALS — BP 164/94 | HR 60 | Temp 99.2°F | Ht 71.0 in | Wt 302.2 lb

## 2021-08-16 DIAGNOSIS — I10 Essential (primary) hypertension: Secondary | ICD-10-CM

## 2021-08-16 DIAGNOSIS — F17200 Nicotine dependence, unspecified, uncomplicated: Secondary | ICD-10-CM | POA: Diagnosis not present

## 2021-08-16 DIAGNOSIS — I129 Hypertensive chronic kidney disease with stage 1 through stage 4 chronic kidney disease, or unspecified chronic kidney disease: Secondary | ICD-10-CM

## 2021-08-16 DIAGNOSIS — E1122 Type 2 diabetes mellitus with diabetic chronic kidney disease: Secondary | ICD-10-CM | POA: Diagnosis not present

## 2021-08-16 DIAGNOSIS — N1831 Chronic kidney disease, stage 3a: Secondary | ICD-10-CM

## 2021-08-16 LAB — POCT GLYCOSYLATED HEMOGLOBIN (HGB A1C): Hemoglobin A1C: 7.2 % — AB (ref 4.0–5.6)

## 2021-08-16 LAB — GLUCOSE, CAPILLARY: Glucose-Capillary: 123 mg/dL — ABNORMAL HIGH (ref 70–99)

## 2021-08-16 NOTE — Assessment & Plan Note (Signed)
Still smoking, working on cutting back. ?- continue cessation discussions ?

## 2021-08-16 NOTE — Assessment & Plan Note (Addendum)
BP management complicated by SDOH. Patient says that he is unable to afford the current cost of his medications at $50 a month, says that he might be able to afford $40. Unfortunately it appears that he has an Buyer, retail that does not cover a lot of his prescription drug copays. He was also under the impression that he was to stop taking amlodipine when he started spironolactone.  ? ?A/P ?- patient will continue spironolactone, coreg, and resume amlodipine.  ?- social work referral ?- instructed patient to call insurance to determine preferred formulary for BP medications.  ?

## 2021-08-16 NOTE — Progress Notes (Signed)
? ?  CC: blood pressure follow up ? ?HPI: ? ?[name] is a 61 y.o. PMH noted below, who presents to the [facility] with complaints of high blood pressure. To see the management of his acute and chronic conditions, please refer to the A&P note under the encounters tab.  ? ?Past Medical History:  ?Diagnosis Date  ? Acute respiratory failure with hypercapnia ([place]) 05/11/2021  ? Heart failure with reduced ejection fraction ([place])   ? 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 Hypertension   ? Obesity, Class III, BMI 40-49.9 (morbid obesity) ([place])   ? Sleep apnea   ? Type 2 diabetes mellitus ([place])   ? ?Review of Systems:  positive for foot numbness, tingling, negative for shortness of breath, chest pain ? ?Physical Exam: ?Gen: obese male in NAD ?HEENT: normocephalic atraumatic, MMM, neck supple ?CV: RRR, no m/r/g   ?Resp: CTAB, normal WOB  ?GI: soft, nontender ?MSK: moves all extremities without difficulty ?Skin:warm and dry ?Neuro:alert answering questions appropriately ?Psych: normal affect ? ? ?Assessment & Plan:  ? ?See Encounters Tab for problem based charting. ? ?Patient discussed with Dr. Philipp Ovens  ? ?

## 2021-08-16 NOTE — Patient Instructions (Addendum)
Jason Mccarty ? ?It was a pleasure seeing you in the clinic today.  ? ?We talked about your medications, diabetes, and your blood pressure. ? ?Medication costs- Please call the number on the back of your insurance card and ask them which medications are on their "preferred formulary" for blood pressure. ? ?Diabetes- continue taking your metformin. I am working with our pharmacists to see if I can get you a coupon for farxiga. Our pharmacist should reach out to you to help. ? ?Please call our clinic at [phone] if you have any questions or concerns. The best time to call is Monday-Friday from 9am-4pm, but there is someone available 24/7 at the same number. If you need medication refills, please notify your pharmacy one week in advance and they will send [place] a request. ?  ?Thank you for letting us take part in your care. We look forward to seeing you next time! ? ?

## 2021-08-18 LAB — MICROALBUMIN / CREATININE URINE RATIO
Creatinine, Urine: 102.2 mg/dL
Microalb/Creat Ratio: 26 mg/g creat (ref 0–29)
Microalbumin, Urine: 26.9 ug/mL

## 2021-08-27 NOTE — Progress Notes (Signed)
Internal Medicine Clinic Attending ° °Case discussed with Dr. DeMaio  At the time of the visit.  We reviewed the resident’s history and exam and pertinent patient test results.  I agree with the assessment, diagnosis, and plan of care documented in the resident’s note. ° ° °

## 2021-09-05 ENCOUNTER — Other Ambulatory Visit: Payer: Self-pay | Admitting: Student

## 2021-09-05 DIAGNOSIS — I5022 Chronic systolic (congestive) heart failure: Secondary | ICD-10-CM

## 2021-09-05 DIAGNOSIS — I10 Essential (primary) hypertension: Secondary | ICD-10-CM

## 2021-09-17 ENCOUNTER — Ambulatory Visit (INDEPENDENT_AMBULATORY_CARE_PROVIDER_SITE_OTHER): Payer: Commercial Managed Care - PPO | Admitting: Student

## 2021-09-17 VITALS — BP 158/77 | HR 80 | Temp 99.0°F | Ht 71.0 in | Wt 298.5 lb

## 2021-09-17 DIAGNOSIS — N1831 Chronic kidney disease, stage 3a: Secondary | ICD-10-CM | POA: Diagnosis not present

## 2021-09-17 DIAGNOSIS — I129 Hypertensive chronic kidney disease with stage 1 through stage 4 chronic kidney disease, or unspecified chronic kidney disease: Secondary | ICD-10-CM | POA: Diagnosis not present

## 2021-09-17 DIAGNOSIS — I10 Essential (primary) hypertension: Secondary | ICD-10-CM

## 2021-09-17 DIAGNOSIS — E1122 Type 2 diabetes mellitus with diabetic chronic kidney disease: Secondary | ICD-10-CM | POA: Diagnosis not present

## 2021-09-17 DIAGNOSIS — Z1211 Encounter for screening for malignant neoplasm of colon: Secondary | ICD-10-CM

## 2021-09-17 DIAGNOSIS — F17210 Nicotine dependence, cigarettes, uncomplicated: Secondary | ICD-10-CM

## 2021-09-17 MED ORDER — ATORVASTATIN CALCIUM 40 MG PO TABS: 40.0000 mg | ORAL_TABLET | Freq: Every day | ORAL | 1 refills | Status: DC

## 2021-09-17 MED ORDER — AMLODIPINE BESYLATE 10 MG PO TABS: 10.0000 mg | ORAL_TABLET | Freq: Every day | ORAL | 2 refills | Status: DC

## 2021-09-17 NOTE — Progress Notes (Signed)
   CC: f/u HTN  HPI:  [name] is a 61 y.o. male with history listed below presenting to the [facility] for f/u HTN. Please see individualized problem based charting for full HPI.  Past Medical History:  Diagnosis Date   Acute respiratory failure with hypercapnia ([place]) 05/11/2021   Heart failure with reduced ejection fraction ([place])    Hepatocellular injury 05/17/2021   Hypertension    Obesity, Class III, BMI 40-49.9 (morbid obesity) ([place])    Sleep apnea    Type 2 diabetes mellitus ([place])     Review of Systems:  Negative aside from that listed in individualized problem based charting.  Physical Exam:  Vitals:   09/17/21 1529  BP: (!) 158/77  Pulse: 80  Temp: 99 F (37.2 C)  TempSrc: Oral  SpO2: 98%  Weight: 298 lb 8 oz (135.4 kg)  Height: '5\' 11"'$  (1.803 m)   Physical Exam Constitutional:      Appearance: He is obese. He is not ill-appearing.  Eyes:     Extraocular Movements: Extraocular movements intact.  Cardiovascular:     Rate and Rhythm: Normal rate and regular rhythm.     Heart sounds: Normal heart sounds. No murmur heard.   No gallop.  Pulmonary:     Effort: Pulmonary effort is normal.     Breath sounds: Normal breath sounds. No wheezing, rhonchi or rales.  Abdominal:     General: Bowel sounds are normal. There is no distension.     Palpations: Abdomen is soft.     Tenderness: There is no abdominal tenderness.  Musculoskeletal:        General: Normal range of motion.     Comments: Trace pitting edema bilaterally  Skin:    General: Skin is warm and dry.  Neurological:     Mental Status: He is alert and oriented to person, place, and time.  Psychiatric:        Mood and Affect: Mood normal.        Behavior: Behavior normal.     Assessment & Plan:   See Encounters Tab for problem based charting.  Patient discussed with Dr. Dareen Piano

## 2021-09-17 NOTE — Assessment & Plan Note (Signed)
Today's Vitals   09/17/21 1529  BP: (!) 158/77  Pulse: 80  Temp: 99 F (37.2 C)  TempSrc: Oral  SpO2: 98%  Weight: 298 lb 8 oz (135.4 kg)  Height: '5\' 11"'$  (1.803 m)  PainSc: 4   PainLoc: Head   Body mass index is 41.63 kg/m.  Patient with history of HTN, on coreg 6.'25mg'$  BID and spironolactone '25mg'$  daily. He was previously on amlodipine '10mg'$  daily with good BP control but stopped taking it due to confusion when starting spiro. Amlodipine was restarted at last visit but patient has not picked up. Discussed at length that amlodipine will need to be restarted to get BP to goal. He is in agreement.  Plan: -continue spiro and coreg -restart norvasc '10mg'$  daily -f/u in 2 months

## 2021-09-17 NOTE — Patient Instructions (Signed)
Jason Mccarty,  It was a pleasure seeing you in the clinic today.   We are restarting your amlodipine and I have sent refills to your pharmacy. Please pick this medicine up to help with your blood pressure. I have placed a referral to the eye doctor for your eye exam and to the stomach doctor for your colonoscopy. They will give you a call to schedule the appointments. Please come back in 2 months for your next A1c check.  Please call our clinic at [phone] if you have any questions or concerns. The best time to call is Monday-Friday from 9am-4pm, but there is someone available 24/7 at the same number. If you need medication refills, please notify your pharmacy one week in advance and they will send [place] a request.   Thank you for letting us take part in your care. We look forward to seeing you next time!

## 2021-09-20 NOTE — Progress Notes (Signed)
Internal Medicine Clinic Attending  Case discussed with Dr. Jinwala  At the time of the visit.  We reviewed the resident's history and exam and pertinent patient test results.  I agree with the assessment, diagnosis, and plan of care documented in the resident's note.  

## 2021-09-28 ENCOUNTER — Other Ambulatory Visit: Payer: Self-pay | Admitting: Student

## 2021-10-02 ENCOUNTER — Other Ambulatory Visit: Payer: Self-pay | Admitting: Student

## 2021-10-12 ENCOUNTER — Ambulatory Visit
Admission: EM | Admit: 2021-10-12 | Discharge: 2021-10-12 | Disposition: A | Discharge status: Home / Self Care | Payer: Commercial Managed Care - PPO | Attending: Internal Medicine | Admitting: Internal Medicine

## 2021-10-12 DIAGNOSIS — H65192 Other acute nonsuppurative otitis media, left ear: Secondary | ICD-10-CM

## 2021-10-12 DIAGNOSIS — J069 Acute upper respiratory infection, unspecified: Secondary | ICD-10-CM

## 2021-10-12 DIAGNOSIS — Z23 Encounter for immunization: Secondary | ICD-10-CM

## 2021-10-12 DIAGNOSIS — S61112A Laceration without foreign body of left thumb with damage to nail, initial encounter: Secondary | ICD-10-CM | POA: Diagnosis not present

## 2021-10-12 MED ORDER — AMOXICILLIN 875 MG PO TABS: 875.0000 mg | ORAL_TABLET | Freq: Two times a day (BID) | ORAL | 0 refills | Status: AC

## 2021-10-12 MED ORDER — TETANUS-DIPHTH-ACELL PERTUSSIS 5-2.5-18.5 LF-MCG/0.5 IM SUSY
0.5000 mL | PREFILLED_SYRINGE | Freq: Once | INTRAMUSCULAR | Status: AC
  Administered 2021-10-12: 0.5 mL via INTRAMUSCULAR

## 2021-10-12 MED ORDER — FLUTICASONE PROPIONATE 50 MCG/ACT NA SUSP: 1.0000 | Freq: Every day | NASAL | 0 refills | Status: DC

## 2021-10-12 NOTE — ED Provider Notes (Addendum)
[name]    CSN: [id number] Arrival date & time: 10/12/21  1822      History   Chief Complaint Chief Complaint  Patient presents with   left thumb injury    HPI Jason Mccarty is a 61 y.o. male.   Patient presents with 2 different chief complaints today.  Patient states that he has an injury to his left thumb that occurred about 3 days ago.  Patient reports that he fell forward and tried to catch himself on cemented block which caused a cut on his left thumb.  He is able to fully move thumb.  Denies any numbness or tingling.  He has been applying bandages and has washed it.  He is not sure when her last tetanus vaccine was.  Patient does not take blood thinners.  Patient also complaining of some nasal congestion that has been present for approximately 2 days.  Denies any associated sick contacts.  Denies fever, cough, chest pain, shortness of breath, sore throat, ear pain, nausea, vomiting, diarrhea, abdominal pain.  Patient denies that he has taken any medications for symptoms.     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   Hypertension    Obesity, Class III, BMI 40-49.9 (morbid obesity) ([place])    Sleep apnea    Type 2 diabetes mellitus [facility])     Patient Active Problem List   Diagnosis Date Noted   Encounter for screening colonoscopy 07/24/2021   ED (erectile dysfunction) 06/19/2021   Tobacco use disorder 05/17/2021   Severe hypertension 05/11/2021   Chronic HFrEF (heart failure with reduced ejection fraction) ([place]) 05/11/2021   Obstructive sleep apnea 05/11/2021   CKD (chronic kidney disease), stage III ([place]) 05/11/2021   Type 2 diabetes mellitus with kidney complication, without long-term current use of insulin ([place]) 05/11/2021   Morbid obesity ([place]) 05/11/2021    Past Surgical History:  Procedure Laterality  Date   HERNIA REPAIR         Home Medications    Prior to Admission medications   Medication Sig Start Date End Date Taking? Authorizing Provider  amoxicillin (AMOXIL) 875 MG tablet Take 1 tablet (875 mg total) by mouth 2 (two) times daily for 10 days. 10/12/21 10/22/21 Yes Lincoln Ginley, Michele Rockers, FNP  fluticasone (FLONASE) 50 MCG/ACT nasal spray Place 1 spray into both nostrils daily. 10/12/21  Yes Teodora Medici, FNP  Accu-Chek FastClix Lancets MISC USE AS DIRECTED 10/02/21   Virl Axe, MD  amLODipine (NORVASC) 10 MG tablet Take 1 tablet (10 mg total) by mouth daily. 09/17/21 06/14/22  Virl Axe, MD  aspirin EC 81 MG tablet Take 1 tablet (81 mg total) by mouth daily. Once a week 06/26/21   Virl Axe, MD  atorvastatin (LIPITOR) 40 MG tablet Take 1 tablet (40 mg total) by mouth daily. 09/17/21   Virl Axe, MD  blood glucose meter kit and supplies KIT Use up to one time daily as directed. 06/26/21   Virl Axe, MD  carvedilol (COREG) 6.25 MG tablet TAKE 1 TABLET BY MOUTH TWICE DAILY WITH A MEAL 09/05/21   Virl Axe, MD  dapagliflozin propanediol (FARXIGA) 5 MG TABS tablet Take 1 tablet (5 mg total) by mouth daily before breakfast. 07/19/21   Angelica Pou, MD  furosemide (LASIX) 20 MG tablet Take 1 tablet by mouth once daily 09/05/21   Virl Axe,  MD  glucose blood test strip Use up to 1 time daily. 06/26/21   Virl Axe, MD  metFORMIN (GLUCOPHAGE) 500 MG tablet Take 1 tablet by mouth once daily with breakfast 09/28/21   Virl Axe, MD  sildenafil (VIAGRA) 50 MG tablet Take 1 tablet (50 mg total) by mouth as needed for erectile dysfunction. Take 1 hour before sexual activity. If you experience headache, reduce to half tablet. 06/18/21 06/18/22  Jose Persia, MD  spironolactone (ALDACTONE) 25 MG tablet Take 1 tablet (25 mg total) by mouth daily. 07/19/21   Angelica Pou, MD    Family History Family History  Problem Relation Age of Onset   Heart failure Father     Hypertension Father     Social History Social History   Tobacco Use   Smoking status: Every Day    Packs/day: 0.10    Types: Cigarettes   Smokeless tobacco: Never   Tobacco comments:    2 per day   Substance Use Topics   Alcohol use: Not Currently    Comment: very rarely   Drug use: Never     Allergies   Benadryl [diphenhydramine]   Review of Systems Review of Systems Per HPI  Physical Exam Triage Vital Signs ED Triage Vitals  Enc Vitals Group     BP 10/12/21 1859 (!) 162/84     Pulse Rate 10/12/21 1859 64     Resp --      Temp 10/12/21 1859 97.6 F (36.4 C)     Temp Source 10/12/21 1859 Oral     SpO2 10/12/21 1859 92 %     Weight --      Height --      Head Circumference --      Peak Flow --      Pain Score 10/12/21 1911 0     Pain Loc --      Pain Edu? --      Excl. in [place]? --    No data found.  Updated Vital Signs BP (!) 162/84 (BP Location: Left Arm)   Pulse 64   Temp 97.6 F (36.4 C) (Oral)   SpO2 92%   Visual Acuity Right Eye Distance:   Left Eye Distance:   Bilateral Distance:    Right Eye Near:   Left Eye Near:    Bilateral Near:     Physical Exam Constitutional:      General: He is not in acute distress.    Appearance: Normal appearance. He is not toxic-appearing or diaphoretic.  HENT:     Head: Normocephalic and atraumatic.     Right Ear: Tympanic membrane and ear canal normal.     Left Ear: Tympanic membrane and ear canal normal.     Nose: Congestion present.     Mouth/Throat:     Mouth: Mucous membranes are moist.     Pharynx: No posterior oropharyngeal erythema.  Eyes:     Extraocular Movements: Extraocular movements intact.     Conjunctiva/sclera: Conjunctivae normal.     Pupils: Pupils are equal, round, and reactive to light.  Cardiovascular:     Rate and Rhythm: Normal rate and regular rhythm.     Pulses: Normal pulses.     Heart sounds: Normal heart sounds.  Pulmonary:     Effort: Pulmonary effort is normal. No  respiratory distress.     Breath sounds: Normal breath sounds. No stridor. No wheezing, rhonchi or rales.  Abdominal:     General: Abdomen is flat.  Bowel sounds are normal.     Palpations: Abdomen is soft.  Musculoskeletal:        General: Normal range of motion.     Cervical back: Normal range of motion.  Skin:    General: Skin is warm and dry.     Comments: Semi-Circle very superficial laceration/skin avulsion present to distal end of left thumb.  Nail has very small crack that is less than 0.5 mm.  Otherwise nail is intact and still attached to nailbed. No damage to nail bed.  No signs of infection include increased redness, swelling, pus.  Capillary refill and pulses normal.  Patient has full range of motion of thumb.  Neurological:     General: No focal deficit present.     Mental Status: He is alert and oriented to person, place, and time. Mental status is at baseline.  Psychiatric:        Mood and Affect: Mood normal.        Behavior: Behavior normal.        Thought Content: Thought content normal.        Judgment: Judgment normal.      UC Treatments / Results  Labs (all labs ordered are listed, but only abnormal results are displayed) Labs Reviewed - No data to display  EKG   Radiology No results found.  Procedures Procedures (including critical care time)  Medications Ordered in UC Medications  Tdap (BOOSTRIX) injection 0.5 mL (0.5 mLs Intramuscular Given 10/12/21 1925)    Initial Impression / Assessment and Plan / UC Course  I have reviewed the triage vital signs and the nursing notes.  Pertinent labs & imaging results that were available during my care of the patient were reviewed by me and considered in my medical decision making (see chart for details).     Very superficial laceration/skin avulsion to distal end of left thumb.  Sutures were not necessary given appearance on physical exam and duration since injury occurred.  Do not think that imaging is  necessary given mechanism of injury.  Wound was cleansed and nonadherent dressing was applied.  Patient advised to keep covered until healed over.  Patient to monitor for signs of infection and follow-up if they occur.  Tetanus vaccine was updated today as well.  Patient has left otitis media.  Will treat with amoxicillin.  Suspect viral upper respiratory infection that is causing other symptoms.  Flonase prescribed for symptoms.  Oxygen recheck was 96% so I think that original oxygen was a fluke.  Discussed supportive care with patient.  Discussed return precautions.  Patient verbalized understanding and was agreeable with plan. Final Clinical Impressions(s) / UC Diagnoses   Final diagnoses:  Laceration of left thumb without foreign body with damage to nail, initial encounter  Other non-recurrent acute nonsuppurative otitis media of left ear  Viral upper respiratory infection     Discharge Instructions      Dressing has been applied to your finger injury.  Please keep covered until healed over.  Tetanus vaccine was updated today.  Please monitor for signs of infection that include increased redness, swelling, pus and follow-up if these occur.  You have an ear infection which is being treated an antibiotic.  It also appears that you have a viral upper respiratory infection that is causing your nasal congestion and sinus pressure.  Nasal spray has been prescribed to help alleviate this as well.    ED Prescriptions     Medication Sig Dispense Auth. Provider  amoxicillin (AMOXIL) 875 MG tablet Take 1 tablet (875 mg total) by mouth 2 (two) times daily for 10 days. 20 tablet [place], [place], [place]   fluticasone [facility]) 50 MCG/ACT nasal spray Place 1 spray into both nostrils daily. 16 g Teodora Medici, [place]      PDMP not reviewed this encounter.   Teodora Medici, [place] 10/12/21 [address],  10/12/21 2010

## 2021-10-12 NOTE — Discharge Instructions (Signed)
Dressing has been applied to your finger injury.  Please keep covered until healed over.  Tetanus vaccine was updated today.  Please monitor for signs of infection that include increased redness, swelling, pus and follow-up if these occur.  You have an ear infection which is being treated an antibiotic.  It also appears that you have a viral upper respiratory infection that is causing your nasal congestion and sinus pressure.  Nasal spray has been prescribed to help alleviate this as well.

## 2021-10-12 NOTE — ED Triage Notes (Signed)
Pt c/o cutting left thumb on brick Wednesday. Wound appears disturbed. Also c/o sinus problems.

## 2021-10-25 ENCOUNTER — Other Ambulatory Visit: Payer: Self-pay | Admitting: Student

## 2021-11-15 ENCOUNTER — Encounter: Payer: Commercial Managed Care - PPO | Admitting: Student

## 2021-11-19 ENCOUNTER — Other Ambulatory Visit: Payer: Self-pay

## 2021-11-19 ENCOUNTER — Encounter: Payer: Self-pay | Admitting: Student

## 2021-11-19 ENCOUNTER — Ambulatory Visit (INDEPENDENT_AMBULATORY_CARE_PROVIDER_SITE_OTHER): Payer: Commercial Managed Care - PPO | Admitting: Student

## 2021-11-19 VITALS — BP 133/68 | HR 71 | Temp 98.4°F | Resp 28 | Ht 71.0 in | Wt 302.0 lb

## 2021-11-19 DIAGNOSIS — N1831 Chronic kidney disease, stage 3a: Secondary | ICD-10-CM | POA: Diagnosis not present

## 2021-11-19 DIAGNOSIS — F17210 Nicotine dependence, cigarettes, uncomplicated: Secondary | ICD-10-CM

## 2021-11-19 DIAGNOSIS — N183 Chronic kidney disease, stage 3 unspecified: Secondary | ICD-10-CM

## 2021-11-19 DIAGNOSIS — E1122 Type 2 diabetes mellitus with diabetic chronic kidney disease: Secondary | ICD-10-CM

## 2021-11-19 DIAGNOSIS — I129 Hypertensive chronic kidney disease with stage 1 through stage 4 chronic kidney disease, or unspecified chronic kidney disease: Secondary | ICD-10-CM | POA: Diagnosis not present

## 2021-11-19 DIAGNOSIS — R350 Frequency of micturition: Secondary | ICD-10-CM | POA: Diagnosis not present

## 2021-11-19 DIAGNOSIS — I10 Essential (primary) hypertension: Secondary | ICD-10-CM

## 2021-11-19 DIAGNOSIS — Z7984 Long term (current) use of oral hypoglycemic drugs: Secondary | ICD-10-CM

## 2021-11-19 LAB — POCT GLYCOSYLATED HEMOGLOBIN (HGB A1C): Hemoglobin A1C: 7.9 % — AB (ref 4.0–5.6)

## 2021-11-19 LAB — GLUCOSE, CAPILLARY: Glucose-Capillary: 192 mg/dL — ABNORMAL HIGH (ref 70–99)

## 2021-11-19 MED ORDER — TAMSULOSIN HCL 0.4 MG PO CAPS: 0.4000 mg | ORAL_CAPSULE | Freq: Every day | ORAL | Status: DC

## 2021-11-19 MED ORDER — DAPAGLIFLOZIN PROPANEDIOL 5 MG PO TABS
5.0000 mg | ORAL_TABLET | Freq: Every day | ORAL | 2 refills | Status: DC
Start: 2021-11-19 — End: 2022-02-27

## 2021-11-19 NOTE — Assessment & Plan Note (Addendum)
Patient continues to take spironolactone, carvedilol, amlodipine daily, and furosemide every other day or as needed for swelling.  He takes his blood pressure at home only a couple times per month when he feels symptoms such as lightheadedness or dizziness.  He usually gets systolic readings in the 460C to 150s range. blood pressure today was 133/68.   - Continue spironolactone, carvedilol, amlodipine, and furosemide

## 2021-11-19 NOTE — Assessment & Plan Note (Signed)
Patient's most recent A1c was 7.2 in May.  Has been taking metformin 500 mg daily.  He said that his insurance was unable to cover dapagliflozin-propanediol and so has not been taking that medication.  His A1c today was 7.9.  Pharmacy said that the co-pay was only $15 so he is planning to start that today.  Coupons were provided to reduce the cost of further.  - Continue metformin 542m daily and start Farxiga 561mdaily

## 2021-11-19 NOTE — Assessment & Plan Note (Addendum)
BMP collected twice in February of this year revealed elevated creatinine to 1.4-1.6 as well as decreased GFR to 48-59.  Patient has comorbid diabetes, this is most likely reflective of CKD stage IIIa.  - Repeat BMP today to monitor renal function - Start [place] with $15 copay and coupons

## 2021-11-19 NOTE — Progress Notes (Signed)
   CC: Hypertension Follow-Up   HPI:   [name] is a 61 y.o. male with a past medical history of hypertension, HFrEF, diabetes, OSA, CKD stage IIIa presents for hypertension follow-up.  He was last seen in [facility] on June 5.     Past Medical History:  Diagnosis Date   Acute respiratory failure with hypercapnia ([place]) 05/11/2021   Heart failure with reduced ejection fraction ([place])    Hepatocellular injury 05/17/2021   Hypertension    Obesity, Class III, BMI 40-49.9 (morbid obesity) ([place])    Sleep apnea    Type 2 diabetes mellitus ([place])      Review of Systems:    Reports urinary frequency, urgency Denies lightheadedness, dizziness, nausea, vomiting, fever, recent illnesses, chills, fatigue, headache   Physical Exam:  Vitals:   11/19/21 1435  BP: 133/68  Pulse: 71  Resp: (!) 28  Temp: 98.4 F (36.9 C)  TempSrc: Oral  SpO2: 99%  Weight: (!) 302 lb (137 kg)  Height: 5' 11"  (1.803 m)    General:   awake and alert, sitting comfortably in chair, cooperative, not in acute distress Skin:   warm and dry, intact without any obvious lesions or scars, no rashes or lesions  Lungs:   normal respiratory effort, breathing unlabored, symmetrical chest rise, no crackles or wheezing Cardiac:   regular rate and rhythm, normal S1 and S2 Abdomen:   suprapubic ultrasound demonstrated normal prostate without evidence of enlargement Psychiatric:   mood and affect normal, intelligible speech    Assessment & Plan:   Severe hypertension Patient continues to take spironolactone, carvedilol, amlodipine daily, and furosemide every other day or as needed for swelling.  He takes his blood pressure at home only a couple times per month when he feels symptoms such as lightheadedness or dizziness.  He usually gets systolic readings in the 631S to 150s range. blood pressure today was 133/68.   - Continue spironolactone, carvedilol, amlodipine, and furosemide   Type 2 diabetes mellitus with kidney  complication, without long-term current use of insulin (HCC) Patient's most recent A1c was 7.2 in May.  Has been taking metformin 500 mg daily.  He said that his insurance was unable to cover dapagliflozin-propanediol and so has not been taking that medication.  His A1c today was 7.9.  Pharmacy said that the co-pay was only $15 so he is planning to start that today.  Coupons were provided to reduce the cost of further.  - Continue metformin 552m daily and start Farxiga 555mdaily   CKD (chronic kidney disease), stage III ([place] in February of this year revealed elevated creatinine to 1.4-1.6 as well as decreased GFR to 48-59.  Patient has comorbid diabetes, this is most likely reflective of CKD stage IIIa.  - Repeat BMP today to monitor renal function - Start [place] $15 copay and coupons   Urinary frequency Patient has been experiencing urinary frequency and urgency for the past couple months. Additionally he has started to notice some leaking of urine. These symptoms have been gradually worsening. Suprapubic ultrasound examination demonstrated normal prostate without any evidence of enlargement.  - Order PSA and consider MRI if levels are elevated - Prescribe tamsulosin and counsel about potential side effect of orthostatic hypotension     See Encounters Tab for problem based charting.  Patient seen with Dr. ViEvette Doffing

## 2021-11-19 NOTE — Assessment & Plan Note (Addendum)
Patient has been experiencing urinary frequency and urgency for the past couple months. Additionally he has started to notice some leaking of urine. These symptoms have been gradually worsening. Suprapubic ultrasound examination demonstrated normal prostate without any evidence of enlargement.  - Order PSA and consider MRI if levels are elevated - Prescribe tamsulosin and counsel about potential side effect of orthostatic hypotension

## 2021-11-19 NOTE — Patient Instructions (Signed)
  Thank you, [name], for allowing [place] to provide your care today. Today we discussed . . .  > Blood pressure       - your blood pressure today was within the normal range, please continue to take your medications as prescribed > Diabetes       - your A1C was elevated today (7.9), continue to take your metformin, we have ordered [place] with 15$ copay > Urinary frequency       - your prostate looked normal on ultrasound today, but we are ordering another lab for this called PSA       - we have prescribed a medication called tamsulosin, which may help but can also cause lightheadedness   I have ordered the following labs for you:   Lab Orders         Glucose, capillary         BMP8+Anion Gap         PSA         POC Hbg A1C       Tests ordered today:  None   Referrals ordered today:   Referral Orders  No referral(s) requested today      I have ordered the following medication/changed the following medications:   Stop the following medications: Medications Discontinued During This Encounter  Medication Reason   dapagliflozin propanediol (FARXIGA) 5 MG TABS tablet Reorder     Start the following medications: Meds ordered this encounter  Medications   dapagliflozin propanediol (FARXIGA) 5 MG TABS tablet    Sig: Take 1 tablet (5 mg total) by mouth daily before breakfast.    Dispense:  30 tablet    Refill:  2   tamsulosin (FLOMAX) capsule 0.4 mg      Follow up: 3 months    Remember: Please start to take [place] for your diabetes. Keep up the good work on the normal blood pressure! Tamsulosin should help with your urinary frequency, but may cause some lightheadedness when standing up. Let us know if you experience any serious side effects.   Should you have any questions or concerns please call the internal medicine clinic at [phone].     Roswell Nickel, MD [place]

## 2021-11-20 NOTE — Progress Notes (Signed)
Internal Medicine Clinic Attending  I saw and evaluated the patient.  I personally confirmed the key portions of the history and exam documented by Dr. Jodi Mourning and I reviewed pertinent patient test results.  The assessment, diagnosis, and plan were formulated together and I agree with the documentation in the resident's note.

## 2021-11-21 LAB — BMP8+ANION GAP
Anion Gap: 14 mmol/L (ref 10.0–18.0)
BUN/Creatinine Ratio: 17 (ref 10–24)
BUN: 24 mg/dL (ref 8–27)
CO2: 23 mmol/L (ref 20–29)
Calcium: 8.8 mg/dL (ref 8.6–10.2)
Chloride: 101 mmol/L (ref 96–106)
Creatinine, Ser: 1.39 mg/dL — ABNORMAL HIGH (ref 0.76–1.27)
Glucose: 147 mg/dL — ABNORMAL HIGH (ref 70–99)
Potassium: 4.1 mmol/L (ref 3.5–5.2)
Sodium: 138 mmol/L (ref 134–144)
eGFR: 57 mL/min/{1.73_m2} — ABNORMAL LOW (ref >59)

## 2021-11-21 LAB — PSA: Prostate Specific Ag, Serum: 13 ng/mL — ABNORMAL HIGH (ref 0.0–4.0)

## 2021-11-23 NOTE — Addendum Note (Signed)
Addended by: Serita Butcher on: 11/23/2021 09:27 AM   Modules accepted: Orders

## 2021-11-27 ENCOUNTER — Other Ambulatory Visit: Payer: Self-pay | Admitting: Student

## 2021-12-04 ENCOUNTER — Telehealth: Payer: Self-pay

## 2021-12-04 NOTE — Telephone Encounter (Signed)
1710 No call back from patient to reschedule PV. Per protocol, PV 8/22 and Colon 9/19 were cancelled and patient notified by letter vial mail.

## 2021-12-04 NOTE — Telephone Encounter (Signed)
Second attempt to reach pt to r/s PV no-showed this morning. Voicemail left requesting return call today. Colonoscopy scheduled for 01/01/22 will be cancelled if no returned call today.

## 2021-12-04 NOTE — Telephone Encounter (Signed)
No Show PV today. Voicemail left to please return call today to reschedule PV. If no callback today, PV and colonoscopy scheduled for 01/01/22 will need to be cancelled.

## 2021-12-10 ENCOUNTER — Ambulatory Visit
Admission: EM | Admit: 2021-12-10 | Discharge: 2021-12-10 | Disposition: A | Discharge status: Home / Self Care | Payer: Commercial Managed Care - PPO | Attending: Physician Assistant | Admitting: Physician Assistant

## 2021-12-10 DIAGNOSIS — Z20822 Contact with and (suspected) exposure to covid-19: Secondary | ICD-10-CM | POA: Diagnosis not present

## 2021-12-10 DIAGNOSIS — I5020 Unspecified systolic (congestive) heart failure: Secondary | ICD-10-CM | POA: Diagnosis not present

## 2021-12-10 DIAGNOSIS — J069 Acute upper respiratory infection, unspecified: Secondary | ICD-10-CM | POA: Diagnosis not present

## 2021-12-10 MED ORDER — BENZONATATE 100 MG PO CAPS
100.0000 mg | ORAL_CAPSULE | Freq: Three times a day (TID) | ORAL | 0 refills | Status: DC
Start: 2021-12-10 — End: 2022-02-27

## 2021-12-10 MED ORDER — PROMETHAZINE-DM 6.25-15 MG/5ML PO SYRP: 5.0000 mL | ORAL_SOLUTION | Freq: Four times a day (QID) | ORAL | 0 refills | Status: AC | PRN

## 2021-12-10 NOTE — ED Triage Notes (Signed)
Pt c/o cough x 3 days. Denies nasal congestion, sore throat, headache, body aches. No known exposures. Denies pain.

## 2021-12-10 NOTE — ED Provider Notes (Signed)
[name]    CSN: [id number] Arrival date & time: 12/10/21  0802      History   Chief Complaint Chief Complaint  Patient presents with   Cough    HPI Jason Mccarty is a 61 y.o. male.   Patient here today for evaluation of cough that he has had the last few days.  He denies any other symptoms including nasal congestion, sore throat, or fever.  He has not taken any medication for symptoms.  He denies any prior lung issues that he is aware of but upon chart review does have history of heart failure.  He does endorse some recent shortness of breath.   The history is provided by the patient.  Cough Associated symptoms: shortness of breath   Associated symptoms: no chills, no eye discharge, no fever and no sore throat     Past Medical History:  Diagnosis Date   Acute respiratory failure with hypercapnia ([place]) 05/11/2021   Heart failure with reduced ejection fraction ([place])    Hepatocellular injury 05/17/2021   Hypertension    Obesity, Class III, BMI 40-49.9 (morbid obesity) ([place])    Sleep apnea    Type 2 diabetes mellitus ([place])     Patient Active Problem List   Diagnosis Date Noted   Urinary frequency 11/19/2021   Encounter for screening colonoscopy 07/24/2021   ED (erectile dysfunction) 06/19/2021   Tobacco use disorder 05/17/2021   Severe hypertension 05/11/2021   Chronic HFrEF (heart failure with reduced ejection fraction) ([place]) 05/11/2021   Obstructive sleep apnea 05/11/2021   CKD (chronic kidney disease), stage III ([place]) 05/11/2021   Type 2 diabetes mellitus with kidney complication, without long-term current use of insulin ([place]) 05/11/2021   Morbid obesity ([place]) 05/11/2021    Past Surgical History:  Procedure Laterality Date   HERNIA REPAIR         Home Medications    Prior to Admission medications   Medication Sig Start Date End Date Taking? Authorizing Provider  benzonatate (TESSALON) 100 MG capsule Take 1 capsule (100 mg total) by mouth  every 8 (eight) hours. 12/10/21  Yes Francene Finders, PA-C  promethazine-dextromethorphan (PROMETHAZINE-DM) 6.25-15 MG/5ML syrup Take 5 mLs by mouth 4 (four) times daily as needed for up to 5 days for cough. 12/10/21 12/15/21 Yes Francene Finders, PA-C  Accu-Chek FastClix Lancets MISC USE AS DIRECTED 10/02/21   Virl Axe, MD  amLODipine (NORVASC) 10 MG tablet Take 1 tablet (10 mg total) by mouth daily. 09/17/21 06/14/22  Virl Axe, MD  aspirin EC 81 MG tablet Take 1 tablet (81 mg total) by mouth daily. Once a week 06/26/21   Virl Axe, MD  atorvastatin (LIPITOR) 40 MG tablet Take 1 tablet (40 mg total) by mouth daily. 09/17/21   Virl Axe, MD  blood glucose meter kit and supplies KIT Use up to one time daily as directed. 06/26/21   Virl Axe, MD  carvedilol (COREG) 6.25 MG tablet TAKE 1 TABLET BY MOUTH TWICE DAILY WITH A MEAL 09/05/21   Virl Axe, MD  dapagliflozin propanediol (FARXIGA) 5 MG TABS tablet Take 1 tablet (5 mg total) by mouth daily before breakfast. 11/19/21   Serita Butcher, MD  fluticasone [facility]) 50 MCG/ACT nasal spray Place 1 spray into both nostrils daily. 10/12/21   Teodora Medici, FNP  furosemide (LASIX) 20 MG tablet Take 1 tablet by mouth once daily 09/05/21   Virl Axe, MD  glucose blood test strip Use up to 1 time daily. 06/26/21  Virl Axe, MD  metFORMIN (GLUCOPHAGE) 500 MG tablet Take 1 tablet by mouth once daily with breakfast 11/27/21   [place], Rayann N, DO  sildenafil (VIAGRA) 50 MG tablet Take 1 tablet (50 mg total) by mouth as needed for erectile dysfunction. Take 1 hour before sexual activity. If you experience headache, reduce to half tablet. 06/18/21 06/18/22  Jose Persia, MD  spironolactone (ALDACTONE) 25 MG tablet Take 1 tablet (25 mg total) by mouth daily. 07/19/21   Angelica Pou, MD    Family History Family History  Problem Relation Age of Onset   Heart failure Father    Hypertension Father     Social History Social History    Tobacco Use   Smoking status: Every Day    Packs/day: 0.10    Types: Cigarettes   Smokeless tobacco: Never   Tobacco comments:    2 - 3 per day   Substance Use Topics   Alcohol use: Not Currently    Comment: very rarely   Drug use: Never     Allergies   Benadryl [diphenhydramine]   Review of Systems Review of Systems  Constitutional:  Negative for chills and fever.  HENT:  Negative for congestion and sore throat.   Eyes:  Negative for discharge and redness.  Respiratory:  Positive for cough and shortness of breath.   Gastrointestinal:  Negative for nausea and vomiting.     Physical Exam Triage Vital Signs ED Triage Vitals  Enc Vitals Group     BP 12/10/21 0820 (!) 145/89     Pulse Rate 12/10/21 0820 70     Resp 12/10/21 0820 17     Temp 12/10/21 0820 98 F (36.7 C)     Temp Source 12/10/21 0820 Oral     SpO2 12/10/21 0820 94 %     Weight --      Height --      Head Circumference --      Peak Flow --      Pain Score 12/10/21 0821 0     Pain Loc --      Pain Edu? --      Excl. in [place]? --    No data found.  Updated Vital Signs BP (!) 145/89 (BP Location: Left Arm)   Pulse 70   Temp 98 F (36.7 C) (Oral)   Resp 17   SpO2 94%   Physical Exam Vitals and nursing note reviewed.  Constitutional:      General: He is not in acute distress.    Appearance: Normal appearance. He is not ill-appearing.  HENT:     Head: Normocephalic and atraumatic.  Eyes:     Conjunctiva/sclera: Conjunctivae normal.  Cardiovascular:     Rate and Rhythm: Normal rate and regular rhythm.  Pulmonary:     Effort: Pulmonary effort is normal. No respiratory distress.     Breath sounds: Normal breath sounds. No wheezing, rhonchi or rales.  Neurological:     Mental Status: He is alert.  Psychiatric:        Mood and Affect: Mood normal.        Behavior: Behavior normal.        Thought Content: Thought content normal.      UC Treatments / Results  Labs (all labs ordered are  listed, but only abnormal results are displayed) Labs Reviewed  SARS CORONAVIRUS 2 (TAT 6-24 HRS)    EKG   Radiology No results found.  Procedures Procedures (including critical care time)  Medications Ordered in UC Medications - No data to display  Initial Impression / Assessment and Plan / UC Course  I have reviewed the triage vital signs and the nursing notes.  Pertinent labs & imaging results that were available during my care of the patient were reviewed by me and considered in my medical decision making (see chart for details).   Suspect likely viral etiology of symptoms.  Will order cough syrup and Tessalon Perles for symptom relief and COVID screening ordered as well.  Will await results for further recommendation.  Encouraged follow-up with any further concerns.  Final Clinical Impressions(s) / UC Diagnoses   Final diagnoses:  Acute upper respiratory infection   Discharge Instructions   None    ED Prescriptions     Medication Sig Dispense Auth. Provider   promethazine-dextromethorphan (PROMETHAZINE-DM) 6.25-15 MG/5ML syrup Take 5 mLs by mouth 4 (four) times daily as needed for up to 5 days for cough. 118 mL Ewell Poe F, PA-C   benzonatate (TESSALON) 100 MG capsule Take 1 capsule (100 mg total) by mouth every 8 (eight) hours. 21 capsule Francene Finders, PA-C      PDMP not reviewed this encounter.   Francene Finders, PA-C 12/10/21 [phone]

## 2021-12-11 ENCOUNTER — Encounter (HOSPITAL_COMMUNITY): Payer: Self-pay

## 2021-12-11 ENCOUNTER — Ambulatory Visit (HOSPITAL_COMMUNITY)
Admission: RE | Admit: 2021-12-11 | Discharge: 2021-12-11 | Disposition: A | Discharge status: Home / Self Care | Payer: Commercial Managed Care - PPO | Source: Ambulatory Visit | Attending: Internal Medicine | Admitting: Internal Medicine

## 2021-12-11 DIAGNOSIS — R350 Frequency of micturition: Secondary | ICD-10-CM

## 2021-12-11 LAB — SARS CORONAVIRUS 2 (TAT 6-24 HRS): SARS Coronavirus 2: NEGATIVE

## 2021-12-24 ENCOUNTER — Ambulatory Visit (HOSPITAL_COMMUNITY)
Admission: RE | Admit: 2021-12-24 | Discharge: 2021-12-24 | Disposition: A | Discharge status: Home / Self Care | Payer: Commercial Managed Care - PPO | Source: Ambulatory Visit | Attending: Internal Medicine | Admitting: Internal Medicine

## 2021-12-24 DIAGNOSIS — R350 Frequency of micturition: Secondary | ICD-10-CM | POA: Diagnosis present

## 2021-12-24 MED ORDER — GADOBUTROL 1 MMOL/ML IV SOLN
10.0000 mL | Freq: Once | INTRAVENOUS | Status: AC | PRN
  Administered 2021-12-24: 10 mL via INTRAVENOUS

## 2021-12-27 ENCOUNTER — Telehealth: Payer: Self-pay

## 2021-12-27 NOTE — Telephone Encounter (Signed)
Patient called regarding his MRI please return patients call.

## 2021-12-31 ENCOUNTER — Other Ambulatory Visit: Payer: Self-pay | Admitting: Student

## 2021-12-31 DIAGNOSIS — C61 Malignant neoplasm of prostate: Secondary | ICD-10-CM

## 2022-01-01 ENCOUNTER — Encounter: Payer: Commercial Managed Care - PPO | Admitting: Gastroenterology

## 2022-01-01 NOTE — Telephone Encounter (Signed)
Requesting MRI results, please call pt back.

## 2022-01-01 NOTE — Telephone Encounter (Signed)
Called patient about prostate MRI results. Resulted as "PI-RADS category 5 lesion of the left peripheral zone and PI-RADS category 4 lesion of the right peripheral zone"- raising suspicion for prostate cancer. Urology referral was already placed, however, I will follow up to ensure that it is a more urgent referral so he can get a biopsy.

## 2022-01-07 ENCOUNTER — Other Ambulatory Visit: Payer: Self-pay | Admitting: Student

## 2022-01-07 DIAGNOSIS — I10 Essential (primary) hypertension: Secondary | ICD-10-CM

## 2022-01-07 DIAGNOSIS — I5022 Chronic systolic (congestive) heart failure: Secondary | ICD-10-CM

## 2022-01-07 NOTE — Telephone Encounter (Signed)
Next appt scheduled 9/27 with PCP.

## 2022-01-09 ENCOUNTER — Ambulatory Visit (INDEPENDENT_AMBULATORY_CARE_PROVIDER_SITE_OTHER): Payer: Commercial Managed Care - PPO | Admitting: Student

## 2022-01-09 VITALS — BP 127/58 | HR 65 | Temp 98.0°F | Ht 71.0 in | Wt 304.8 lb

## 2022-01-09 DIAGNOSIS — E1122 Type 2 diabetes mellitus with diabetic chronic kidney disease: Secondary | ICD-10-CM

## 2022-01-09 DIAGNOSIS — N1831 Chronic kidney disease, stage 3a: Secondary | ICD-10-CM | POA: Diagnosis not present

## 2022-01-09 DIAGNOSIS — R350 Frequency of micturition: Secondary | ICD-10-CM

## 2022-01-09 DIAGNOSIS — Z87891 Personal history of nicotine dependence: Secondary | ICD-10-CM

## 2022-01-09 DIAGNOSIS — I130 Hypertensive heart and chronic kidney disease with heart failure and stage 1 through stage 4 chronic kidney disease, or unspecified chronic kidney disease: Secondary | ICD-10-CM

## 2022-01-09 DIAGNOSIS — I5022 Chronic systolic (congestive) heart failure: Secondary | ICD-10-CM

## 2022-01-09 DIAGNOSIS — I10 Essential (primary) hypertension: Secondary | ICD-10-CM

## 2022-01-09 DIAGNOSIS — F17210 Nicotine dependence, cigarettes, uncomplicated: Secondary | ICD-10-CM

## 2022-01-09 DIAGNOSIS — Z1211 Encounter for screening for malignant neoplasm of colon: Secondary | ICD-10-CM

## 2022-01-09 MED ORDER — TAMSULOSIN HCL 0.4 MG PO CAPS: 0.4000 mg | ORAL_CAPSULE | Freq: Every day | ORAL | 1 refills | Status: DC

## 2022-01-09 MED ORDER — FUROSEMIDE 20 MG PO TABS: 20.0000 mg | ORAL_TABLET | Freq: Every day | ORAL | 1 refills | Status: DC

## 2022-01-09 NOTE — Assessment & Plan Note (Signed)
Patient has canceled his appointment with GI for screening colonoscopy as he would like to first appropriately complete biopsy and potential treatment for high-risk prostate lesions noted on recent MRI. We discussed that we will follow up on colon cancer screening once he completes his upcoming visits with Urology.

## 2022-01-09 NOTE — Progress Notes (Signed)
   CC: f/u of MRI results  HPI:  [name] is a 61 y.o. male with history listed below presenting to the [facility] for f/u of MRI results. Please see individualized problem based charting for full HPI.  Past Medical History:  Diagnosis Date   Acute respiratory failure with hypercapnia ([place]) 05/11/2021   Heart failure with reduced ejection fraction ([place])    Hepatocellular injury 05/17/2021   Hypertension    Obesity, Class III, BMI 40-49.9 (morbid obesity) ([place])    Sleep apnea    Type 2 diabetes mellitus ([place])     Review of Systems:  Negative aside from that listed in individualized problem based charting.  Physical Exam:  Vitals:   01/09/22 1459  BP: (!) 127/58  Pulse: 65  Temp: 98 F (36.7 C)  TempSrc: Oral  SpO2: 99%  Weight: (!) 304 lb 12.8 oz (138.3 kg)  Height: 5' 11"  (1.803 m)   Physical Exam Constitutional:      Appearance: He is obese. He is not ill-appearing.  Cardiovascular:     Rate and Rhythm: Normal rate and regular rhythm.     Pulses: Normal pulses.     Heart sounds: Normal heart sounds. No murmur heard.    No friction rub. No gallop.     Comments: No JVD noted. Pulmonary:     Effort: Pulmonary effort is normal.     Breath sounds: Normal breath sounds. No wheezing, rhonchi or rales.  Abdominal:     General: Bowel sounds are normal. There is no distension.     Palpations: Abdomen is soft.     Tenderness: There is no abdominal tenderness.  Musculoskeletal:        General: Normal range of motion.     Comments: 1-2+ pitting edema bilaterally up to mid-shins.  Skin:    Comments: Extremities are warm and well perfused.  Neurological:     General: No focal deficit present.     Mental Status: He is alert and oriented to person, place, and time.  Psychiatric:        Mood and Affect: Mood normal.        Behavior: Behavior normal.      Assessment & Plan:   See Encounters Tab for problem based charting.  Patient discussed with Dr.  Saverio Danker

## 2022-01-09 NOTE — Assessment & Plan Note (Signed)
Patient living with chronic HFrEF (EF 40-45%). He is on GDMT with lasix, coreg, farxiga, and spironolactone. Tolerating medications well. States that he only uses lasix a few times a week. He does have 1-2+ pitting edema in bilateral LE up to mid-shins on exam today, but no JVD or crackles noted. Extremities are warm and well-perfused. We discussed taking lasix daily until his swelling resolves and he confirms understanding. Also discussed using lasix prn whenever he notes weight gain >3lbs in one day or >5lbs in one week.   Plan: -continue coreg, farxiga, spiro -lasix daily until swelling resolves (refilled prescription)

## 2022-01-09 NOTE — Patient Instructions (Signed)
[name],  It was a pleasure seeing you in the clinic today.   Please make sure to go to your appointment with Dr. Junious Silk (Urology) on 01/30/2022 for your prostate biopsy. I hope it all goes well. Please make sure to take a lasix (furosemide) tablet once daily until you notice that your swelling has gone away. I have refilled your tamsulosin and sent it to the [facility] on [address]. Please come back in 2 months for your next visit.  Please call our clinic at [phone] if you have any questions or concerns. The best time to call is Monday-Friday from 9am-4pm, but there is someone available 24/7 at the same number. If you need medication refills, please notify your pharmacy one week in advance and they will send [place] a request.   Thank you for letting us take part in your care. We look forward to seeing you next time!

## 2022-01-09 NOTE — Assessment & Plan Note (Signed)
BP at goal today, continue current medications (coreg, norvasc, farxiga, spironolactone, lasix).

## 2022-01-09 NOTE — Assessment & Plan Note (Signed)
Patient with recent visit for urinary frequency, which prompted prostate cancer screening. His PSA was elevated to 13. Follow up MRI of prostate revealed a PI-RADS 5 lesion in the left peripheral zone and a PI-RADS 4 lesion in the right peripheral zone. He was referred to [facility], and he had a visit with Dr. Junious Silk this past week. He is scheduled for a prostate biopsy on 01/30/2022 and will have follow up of biopsy results with them on 02/06/2022. We discussed importance of biopsy and he understands. He is requesting a refill of his flomax prescription, which I performed today.  Plan: -f/u with [facility] for prostate biopsy and subsequent treatment pending results -refilled flomax -f/u in 2 months

## 2022-01-13 DIAGNOSIS — C61 Malignant neoplasm of prostate: Secondary | ICD-10-CM

## 2022-01-13 HISTORY — DX: Malignant neoplasm of prostate: C61

## 2022-01-13 NOTE — Progress Notes (Signed)
Internal Medicine Clinic Attending  Case discussed with Dr. Jinwala  At the time of the visit.  We reviewed the resident's history and exam and pertinent patient test results.  I agree with the assessment, diagnosis, and plan of care documented in the resident's note.  

## 2022-01-21 ENCOUNTER — Encounter: Payer: Commercial Managed Care - PPO | Admitting: Gastroenterology

## 2022-01-23 ENCOUNTER — Encounter: Payer: Commercial Managed Care - PPO | Admitting: Student

## 2022-02-07 ENCOUNTER — Telehealth: Payer: Self-pay

## 2022-02-07 NOTE — Telephone Encounter (Signed)
    1. I confirmed with the patient he is aware of his referral to the clinic 11/10, arriving @ 8:00 am    2. I discussed the format of the clinic and the physicians he will be seeing that day.   3. I discussed where the clinic is located and how to contact me.   4. I confirmed his address and informed him I would be mailing a packet of information and forms to be completed. I asked him to bring them with him the day of his appointment.    He voiced understanding of the above. I asked him to call me if he has any questions or concerns regarding his appointments or the forms he needs to complete.

## 2022-02-11 ENCOUNTER — Encounter: Payer: Commercial Managed Care - PPO | Admitting: Student

## 2022-02-21 ENCOUNTER — Telehealth: Payer: Self-pay

## 2022-02-21 NOTE — Progress Notes (Signed)
Radiation Oncology         [phone] ________________________________  Multidisciplinary Prostate Cancer Clinic  Initial Radiation Oncology Consultation  Name: Jason Mccarty MRN: [id number]  Date: 02/22/2022  DOB: 05/30/1960  HX:[id number], Soyla Murphy, MD  [name], MD   REFERRING PHYSICIAN: [name], MD  DIAGNOSIS: 61 y.o. gentleman with stage T1c adenocarcinoma of the prostate with a Gleason's score of 4+4 and a PSA of 10    ICD-10-CM   1. Malignant neoplasm of prostate ([place])  [place] ILLNESS::Jason Mccarty is a 61 y.o. gentleman.  He was noted to have an elevated PSA of 13 by his primary care physician, Dr. Jodi Mourning. He underwent prostate MRI on 12/24/21 showing: 18 mm left peripheral zone lesion (PI-RADS 5); 14 mm right peripheral zone lesion (PI-RADS 4); prostate volume 23 g. Accordingly, he was referred for evaluation in urology by Dr. Junious Silk on 01/02/22. Repeat PSA obtained that day showed decreased but persistently elevated PSA of 10. The patient proceeded to transrectal ultrasound with 12 biopsies of the prostate on 01/30/22, digital rectal examination performed at that time showed no nodules.  The prostate volume measured 24.31 cc.  Out of 12 core biopsies, all 12 were positive.  The maximum Gleason score was 4+4, and this was seen in left base. Additionally, Gleason 4+3 was seen in left apex lateral (with perineural invasion), right base, and right base lateral. The remaining cores showed Gleason 3+4.  The patient reviewed the biopsy results with his urologist and he has kindly been referred today to the multidisciplinary prostate cancer clinic for presentation of pathology and radiology studies in our conference for discussion of potential radiation treatment options and clinical evaluation.  PSMA PET for staging has been ordered but not yet completed.  PREVIOUS RADIATION THERAPY: No  PAST MEDICAL HISTORY:  has a past medical history of Acute  respiratory failure with hypercapnia ([place]) (05/11/2021), Heart failure with reduced ejection fraction ([place]), Hepatocellular injury (05/17/2021), Hypertension, Obesity, Class III, BMI 40-49.9 (morbid obesity) ([place]), Sleep apnea, and Type 2 diabetes mellitus ([place]).    PAST SURGICAL HISTORY: Past Surgical History:  Procedure Laterality Date   HERNIA REPAIR      FAMILY HISTORY: family history includes Heart failure in his father; Hypertension in his father.  SOCIAL HISTORY:  reports that he has been smoking cigarettes. He has been smoking an average of .1 packs per day. He has never used smokeless tobacco. He reports that he does not currently use alcohol. He reports that he does not use drugs.  ALLERGIES: Benadryl [diphenhydramine]  MEDICATIONS:  Current Outpatient Medications  Medication Sig Dispense Refill   Multiple Vitamins-Minerals (MENS MULTIVITAMIN PLUS PO) Take by mouth daily.     solifenacin (VESICARE) 5 MG tablet Take 5 mg by mouth daily.     Accu-Chek FastClix Lancets MISC USE AS DIRECTED 102 each 1   amLODipine (NORVASC) 10 MG tablet Take 1 tablet (10 mg total) by mouth daily. 60 tablet 2   aspirin EC 81 MG tablet Take 1 tablet (81 mg total) by mouth daily. Once a week 30 tablet 3   atorvastatin (LIPITOR) 40 MG tablet Take 1 tablet (40 mg total) by mouth daily. 60 tablet 1   benzonatate (TESSALON) 100 MG capsule Take 1 capsule (100 mg total) by mouth every 8 (eight) hours. 21 capsule 0   blood glucose meter kit and supplies KIT Use up to one time daily as directed. 1 each 0  carvedilol (COREG) 6.25 MG tablet TAKE 1 TABLET BY MOUTH TWICE DAILY WITH A MEAL 120 tablet 0   dapagliflozin propanediol (FARXIGA) 5 MG TABS tablet Take 1 tablet (5 mg total) by mouth daily before breakfast. 30 tablet 2   fluticasone (FLONASE) 50 MCG/ACT nasal spray Place 1 spray into both nostrils daily. 16 g 0   furosemide (LASIX) 20 MG tablet Take 1 tablet (20 mg total) by mouth daily. 60 tablet 1    glucose blood test strip Use up to 1 time daily. 30 each 11   metFORMIN (GLUCOPHAGE) 500 MG tablet Take 1 tablet by mouth once daily with breakfast 30 tablet 11   sildenafil (VIAGRA) 50 MG tablet Take 1 tablet (50 mg total) by mouth as needed for erectile dysfunction. Take 1 hour before sexual activity. If you experience headache, reduce to half tablet. 10 tablet 0   spironolactone (ALDACTONE) 25 MG tablet Take 1 tablet (25 mg total) by mouth daily. 30 tablet 11   tamsulosin (FLOMAX) 0.4 MG CAPS capsule Take 1 capsule (0.4 mg total) by mouth daily after breakfast. 30 capsule 1   No current facility-administered medications for this encounter.        REVIEW OF SYSTEMS:  On review of systems, the patient reports that he is doing well overall. He denies any chest pain, shortness of breath, cough, fevers, chills, night sweats, unintended weight changes. He denies any bowel disturbances, and denies abdominal pain, nausea or vomiting. He denies any new musculoskeletal or joint aches or pains. His IPSS was 14 indicating moderate urinary symptoms (Reference 0-7 mild, 8-19 moderate, 20-35 severe).  His IIEF was 5, indicating he severe erectile dysfunction (Reference - 22-25 None, 17-21 Mild, 8-16 Moderate, 1-7 Severe). A complete review of systems is obtained and is otherwise negative.  PHYSICAL EXAM:  Wt Readings from Last 3 Encounters:  02/22/22 300 lb (136.1 kg)  01/09/22 (!) 304 lb 12.8 oz (138.3 kg)  11/19/21 (!) 302 lb (137 kg)   Temp Readings from Last 3 Encounters:  02/22/22 98.4 F (36.9 C)  01/09/22 98 F (36.7 C) (Oral)  12/10/21 98 F (36.7 C) (Oral)   BP Readings from Last 3 Encounters:  02/22/22 127/60  01/09/22 (!) 127/58  12/10/21 (!) 145/89   Pulse Readings from Last 3 Encounters:  02/22/22 (!) 56  01/09/22 65  12/10/21 70    /10  In general this is a well appearing man in no acute distress. He's alert and oriented x4 and appropriate throughout the examination.  Cardiopulmonary assessment is negative for acute distress and he exhibits normal effort.    KPS = 100  100 - Normal; no complaints; no evidence of disease. 90   - Able to carry on normal activity; minor signs or symptoms of disease. 80   - Normal activity with effort; some signs or symptoms of disease. 59   - Cares for self; unable to carry on normal activity or to do active work. 60   - Requires occasional assistance, but is able to care for most of his personal needs. 50   - Requires considerable assistance and frequent medical care. 86   - Disabled; requires special care and assistance. 15   - Severely disabled; hospital admission is indicated although death not imminent. 62   - Very sick; hospital admission necessary; active supportive treatment necessary. 10   - Moribund; fatal processes progressing rapidly. 0     - Dead  Karnofsky DA, Abelmann [place], Craver LS and Burchenal  JH (1948) The use of the nitrogen mustards in the palliative treatment of carcinoma: with particular reference to bronchogenic carcinoma Cancer 1 634-56   LABORATORY DATA:  Lab Results  Component Value Date   WBC 9.0 05/13/2021   HGB 13.7 05/13/2021   HCT 41.9 05/13/2021   MCV 89.7 05/13/2021   PLT 220 05/13/2021   Lab Results  Component Value Date   NA 138 11/19/2021   K 4.1 11/19/2021   CL 101 11/19/2021   CO2 23 11/19/2021   Lab Results  Component Value Date   ALT 29 05/17/2021   AST 23 05/17/2021   ALKPHOS 102 05/17/2021   BILITOT 0.4 05/17/2021     RADIOGRAPHY: No results found.    IMPRESSION/PLAN: 61 y.o. gentleman with Stage T1c adenocarcinoma of the prostate with a Gleason score of 4+4 and a PSA of 10.    We discussed the patient's workup and outlined the nature of prostate cancer in this setting. The patient's T stage, Gleason's score, and PSA put him into the high risk group. Accordingly, he is eligible for a variety of potential treatment options including LT-ADT in combination with 8  weeks of external radiation, 5 weeks of external radiation with an upfront brachytherapy boost, or prostatectomy. We discussed the available radiation techniques, and focused on the details and logistics of delivery. We discussed and outlined the risks, benefits, short and long-term effects associated with radiotherapy and compared and contrasted these with prostatectomy. We discussed the role of SpaceOAR gel in reducing the rectal toxicity associated with radiotherapy. We also detailed the role of ADT in the treatment of high risk prostate cancer and outlined the associated side effects that could be expected with this therapy. He appears to have a good understanding of his disease and our treatment recommendations which are of curative intent.  He was encouraged to ask questions that were answered to his/their stated satisfaction.  At the end of the conversation the patient is interested in moving forward with PSMA PET for staging.  Depending on those results, he leaning toward LT-ADT with Orgovyx, then Prostate Seed boost with SpaceOAR followed by Prostate IMRT to the pelvis.  We personally spent 60 minutes in this encounter including chart review, reviewing radiological studies, meeting face-to-face with the patient, entering orders and completing documentation.   ------------------------------------------------   Tyler Pita, MD [place]: [phone]  Fax: [phone] [email or web address]  Skype  [email or web address]   This document serves as a record of services personally performed by Tyler Pita, MD. It was created on his behalf by Wilburn Mylar, a trained medical scribe. The creation of this record is based on the scribe's personal observations and the provider's statements to them. This document has been checked and approved by the attending provider.

## 2022-02-21 NOTE — Telephone Encounter (Signed)
Spoke to patient to remind him of appointment tomorrow and to also remind him to bring packet to appointment as well

## 2022-02-22 ENCOUNTER — Ambulatory Visit
Admission: RE | Admit: 2022-02-22 | Discharge: 2022-02-22 | Disposition: A | Discharge status: Home / Self Care | Payer: Commercial Managed Care - PPO | Source: Ambulatory Visit | Attending: Radiation Oncology | Admitting: Radiation Oncology

## 2022-02-22 ENCOUNTER — Inpatient Hospital Stay (HOSPITAL_BASED_OUTPATIENT_CLINIC_OR_DEPARTMENT_OTHER): Payer: Commercial Managed Care - PPO | Admitting: Genetic Counselor

## 2022-02-22 ENCOUNTER — Inpatient Hospital Stay: Payer: Commercial Managed Care - PPO | Attending: Oncology | Admitting: Oncology

## 2022-02-22 ENCOUNTER — Encounter: Payer: Self-pay | Admitting: *Deleted

## 2022-02-22 VITALS — BP 127/60 | HR 56 | Temp 98.4°F | Resp 18 | Ht 71.0 in | Wt 300.0 lb

## 2022-02-22 DIAGNOSIS — F17210 Nicotine dependence, cigarettes, uncomplicated: Secondary | ICD-10-CM | POA: Insufficient documentation

## 2022-02-22 DIAGNOSIS — I10 Essential (primary) hypertension: Secondary | ICD-10-CM | POA: Insufficient documentation

## 2022-02-22 DIAGNOSIS — C61 Malignant neoplasm of prostate: Secondary | ICD-10-CM

## 2022-02-22 DIAGNOSIS — Z79899 Other long term (current) drug therapy: Secondary | ICD-10-CM | POA: Diagnosis not present

## 2022-02-22 DIAGNOSIS — Z9079 Acquired absence of other genital organ(s): Secondary | ICD-10-CM | POA: Diagnosis not present

## 2022-02-22 DIAGNOSIS — R351 Nocturia: Secondary | ICD-10-CM | POA: Diagnosis not present

## 2022-02-22 DIAGNOSIS — Z888 Allergy status to other drugs, medicaments and biological substances status: Secondary | ICD-10-CM | POA: Diagnosis not present

## 2022-02-22 DIAGNOSIS — Z8249 Family history of ischemic heart disease and other diseases of the circulatory system: Secondary | ICD-10-CM | POA: Diagnosis not present

## 2022-02-22 DIAGNOSIS — E119 Type 2 diabetes mellitus without complications: Secondary | ICD-10-CM | POA: Diagnosis not present

## 2022-02-22 DIAGNOSIS — R350 Frequency of micturition: Secondary | ICD-10-CM | POA: Diagnosis not present

## 2022-02-22 DIAGNOSIS — Z794 Long term (current) use of insulin: Secondary | ICD-10-CM

## 2022-02-22 NOTE — Consult Note (Signed)
[facility]     02/22/2022   --------------------------------------------------------------------------------   Jason Mccarty  MRN: [id number]  DOB: Dec 19, 1960, 61 year old Male  SSN:    PRIMARY CARE:  Aldine Contes, MBBS  REFERRING:  Georgette Dover, MD  PROVIDER:  [name], M.D.  TREATING:  Raynelle Bring, M.D.  LOCATION:  [facility] [phone]     --------------------------------------------------------------------------------   CC/HPI: CC: Prostate Cancer   Physician requesting consult: Dr. Eda Keys  PCP: Dr. 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 This prompted a TRUS biopsy on 01/30/22 which demonstrated Gleason 4+4=8 adenocarcinoma with 12 out of 12 biopsy cores positive for malignancy.   Family history: None.   Imaging studies: PSMA PET scan (PENDING):   PMH: He has a history of CAD s/p MI, hypertension, hyperlipidemia, obesity, and diabetes. He was actually in what he thought was excellent health until he suffered his myocardial infarction in February and is now on multiple medications as noted below. He has not had follow-up with cardiology since his heart attack.  PSH: Umbilical hernia repair with mesh   TNM stage: cT1c N0 M0  PSA: 10.0  Gleason score: 4+4=8 (GG 4)  Biopsy (01/30/22): 12/12 cores positive  Left: L lateral apex (95%, 4+3=7), L apex (90%, 3+4=7), L lateral mid (95%, 3+4=7), L mid (80%, 3+4=7), L lateral base (90%, 3+4=7), L base (30%, 4+4=8)  Right: R apex (95%, 3+4=7), R lateral apex (90%, 3+4=7), R mid (60%, 3+4=7), R lateral mid (60%, 3+4=7), R base (60%, 4+3=7), R lateral base (5%, 4+3=7)  Prostate volume: 24.3 cc   Nomogram  OC disease: 17%  EPE:  81%  SVI: 40%  LNI: 35%  PFS (5 year, 10 year): 32%, 20%   Urinary function: IPSS is 15. He does have significant urinary urgency and frequency symptoms. He is on tamsulosin but has not noted that this made much of a difference. He is now on Solifenacin 5 mg and has found this to be helpful.  Erectile function: SHIM score is 5. He has severe erectile dysfunction.     ALLERGIES: Benadryl - Itching    MEDICATIONS: Aspirin 81 mg tablet,chewable  Metformin Hcl  Tamsulosin Hcl 0.4 mg capsule  Amlodipine Besylate  Atorvastatin Calcium  Carvedilol  Levofloxacin 750 mg tablet take 1 tablet po morning of procedure  Multivitamin  Sildenafil Citrate 20 mg tablet take 1 -5 tablets po about 1 hour before sexual activity  Sinus Med  Solifenacin Succinate 5 mg tablet 1 tablet PO Daily     GU PSH: Prostate Needle Biopsy - 01/30/2022       PSH Notes: Umbilical hernia repair 2119,   NON-GU PSH: Surgical Pathology, Gross And Microscopic Examination For Prostate Needle - 01/30/2022     GU PMH: Prostate Cancer, I had a long discussion with the patient using the understanding prostate cancer booklet and his path report as a reference. We discussed his stage, grade and prognosis. We discussed the nature risks and benefits of active surveillance, radical prostatectomy, external beam radiotherapy, and brachytherapy. We discussed the role of androgen deprivation and chemotherapy in prostate cancer. We also discussed other ablative techniques such as HiFU and cryotherapy as well as whole gland versus focal treatment. We discussed  specifically how each treatment might affect bowel, bladder and sexual function. We discussed how each treatment might effect salvage treatments and active surveillance might lead to progression and more difficult treatment in the future. All questions answered. ALso disc gold seed and spaceoar. Will stage with a PET scan and refer to prostate cancer clinic. - 02/06/2022 Urinary  Frequency, add vsicare . urine for cx - 02/06/2022 Elevated PSA, he toerlated well. exam benign. Prostate 23 g - 01/30/2022, We discussed the nature, risks and benefits of PSA screening as well as the nature of elevated PSA (benign versus malignant). We discussed the possibility of prostate cancer and that as the PSA rises above the level of 2.5 and even over 1, the risk of prostate cancer on biopsy increases. We discussed the management of prostate cancer might include active surveillance or treatment depending on patient and cancer characteristics. In that context, we discussed the nature, risks and benefits of continued surveillance, other lab tests, transrectal ultrasound/prostate biopsy, or prostate MRI. All questions answered. Disc MRI findings can also be benign vs malignant. He will proceed with prostate bx. Given his prostate size and the size of the lesions, he doesn't need a specific fusion bx. PSA repeated. , - 01/02/2022 ED due to arterial insufficiency, disc nature r/b/a to pde5i - 01/02/2022    NON-GU PMH: Diabetes Type 2 Hypercholesterolemia Hypertension Myocardial Infarction    FAMILY HISTORY: Heart Attack - Father    Notes: 3 sons   SOCIAL HISTORY: Marital Status: Widowed Race: Black or African American Current Smoking Status: Patient smokes occasionally. Has smoked since 12/15/1991. Smokes 1/2 pack per day.   Tobacco Use Assessment Completed: Used Tobacco in last 30 days? Social Drinker.  Drinks 3 caffeinated drinks per day. Patient's occupation Geologist, engineering.    REVIEW OF SYSTEMS:    GU Review Male:   Patient denies frequent urination, hard to postpone urination, burning/ pain with urination, get up at night to urinate, leakage of urine, stream starts and stops, trouble starting your streams, and have to strain to urinate .  Gastrointestinal (Upper):   Patient denies nausea and vomiting.  Gastrointestinal (Lower):   Patient denies diarrhea and constipation.   Constitutional:   Patient denies fever, night sweats, weight loss, and fatigue.  Skin:   Patient denies skin rash/ lesion and itching.  Eyes:   Patient denies blurred vision and double vision.  Ears/ Nose/ Throat:   Patient denies sore throat and sinus problems.  Hematologic/Lymphatic:   Patient denies swollen glands and easy bruising.  Cardiovascular:   Patient denies leg swelling and chest pains.  Respiratory:   Patient denies cough and shortness of breath.  Endocrine:   Patient denies excessive thirst.  Musculoskeletal:   Patient denies back pain and joint pain.  Neurological:   Patient denies headaches and dizziness.  Psychologic:   Patient denies depression and anxiety.   VITAL SIGNS: None   MULTI-SYSTEM PHYSICAL EXAMINATION:    Constitutional: Well-nourished. No physical deformities. Normally developed. Good grooming.  Gastrointestinal: Obese     Complexity of Data:  Lab Test Review:   PSA  Records Review:   Pathology Reports, Previous Patient Records   01/02/22  PSA  Total PSA 10.00 ng/mL  Free PSA 1.15 ng/mL  % Free PSA 12 % PSA    PROCEDURES: None   ASSESSMENT:      ICD-10 Details  1 GU:   Prostate Cancer - C61    PLAN:  Document Letter(s):  Created for Patient: Clinical Summary         Notes:   1. Prostate cancer: I had a detailed discussion with Jason Mccarty and his fiance this morning. Unfortunately, he has not yet had his PSMA PET scan and we are awaiting these results. We did have a discussion today assuming that his PET scan results do not indicate metastatic disease.   The patient was counseled about the natural history of prostate cancer and the standard treatment options that are available for prostate cancer. It was explained to him how his age and life expectancy, clinical stage, Gleason score/prognostic grade group, and PSA (and PSA density) affect his prognosis, the decision to proceed with additional staging studies, as well as how that  information influences recommended treatment strategies. We discussed the roles for active surveillance, radiation therapy, surgical therapy, androgen deprivation, as well as ablative therapy and other investigational options for the treatment of prostate cancer as appropriate to his individual cancer situation. We discussed the risks and benefits of these options with regard to their impact on cancer control and also in terms of potential adverse events, complications, and impact on quality of life particularly related to urinary and sexual function. The patient was encouraged to ask questions throughout the discussion today and all questions were answered to his stated satisfaction. In addition, the patient was provided with and/or directed to appropriate resources and literature for further education about prostate cancer and treatment options. We discussed surgical therapy for prostate cancer including the different available surgical approaches. We discussed, in detail, the risks and expectations of surgery with regard to cancer control, urinary control, and erectile function as well as the expected postoperative recovery process. Additional risks of surgery including but not limited to bleeding, infection, hernia formation, nerve damage, lymphocele formation, bowel/rectal injury potentially necessitating colostomy, damage to the urinary tract resulting in urine leakage, urethral stricture, and the cardiopulmonary risks such as myocardial infarction, stroke, death, venothromboembolism, etc. were explained. The risk of open surgical conversion for robotic/laparoscopic prostatectomy was also discussed.   He is not inclined to make a definite decision regarding treatment at this time. Obviously, he will need to await the results of his PSMA PET scan which has not yet been performed. He is scheduled to see Dr. Tammi Klippel and Dr. Alen Blew later this morning for further discussion. I certainly will be happy to help him  if he wishes to consider surgical therapy. My tentative plan would be to perform a non-nerve sparing robot-assisted laparoscopic radical prostatectomy and bilateral pelvic lymphadenectomy. Considering his high risk disease, he understands that he may require multimodality therapy. In addition, he would need to see cardiology prior to surgery to assess his cardiac risk considering his recent myocardial infarction. There also may be some additional complexity to his surgery from a technical standpoint due to his significant obesity. If he does elect to proceed, I will see him before surgery for a complete exam.   CC: Dr. [name]  Dr. Virl Axe  Dr. Tyler Pita  Dr. Zola Button    E & M CODES: We spent 63 minutes dedicated to evaluation and management time, including face to face interaction, discussions on coordination of care, documentation, result review, and discussion with others as applicable.

## 2022-02-22 NOTE — Progress Notes (Signed)
                               Care Plan Summary  Name: Jason Mccarty DOB: 05-07-1960   Your Medical Team:   Urologist -  Dr. Raynelle Bring, [facility]  Radiation Oncologist - Dr. Tyler Pita, [facility]   Medical Oncologist - Dr. Zola Button, [place]  Recommendations: 1)Surgery  2) Seed Implant & External Beam Radiation with Androgen Deprivation Therapy  3)  * These recommendations are based on information available as of today's consult.      Recommendations may change depending on the results of further tests or exams.    Next Steps: 1) PSMA PET Scan  2) F/U with Cardiologist 3) Consider your options. Once you have made a decision, please reach out to [facility], Nurse Navigator, to finalize treatment decision and review next steps.  When appointments need to be scheduled, you will be contacted by [facility] and/or [facility].  Questions?  Please do not hesitate to call [name], BSN, RN at [phone] with any questions or concerns.  [name] is your Oncology Nurse Navigator and is available to assist you while you're receiving your medical care at [facility].

## 2022-02-22 NOTE — Addendum Note (Signed)
Encounter addended by: Raynelle Bring, MD on: 02/22/2022 11:40 AM  Actions taken: Clinical Note Signed

## 2022-02-22 NOTE — Progress Notes (Signed)
REFERRING PROVIDER: Zola Button, MD  PRIMARY PROVIDER:  Virl Axe, MD  PRIMARY REASON FOR VISIT:  1. Malignant neoplasm of prostate ([place])     HISTORY OF PRESENT ILLNESS:   Mr. Stettler, a 61 y.o. male, was seen for a [place] cancer genetics consultation at the request of Dr. Alen Blew due to a personal history of cancer.  Mr. Lori presents to clinic today to discuss the possibility of a hereditary predisposition to cancer, to discuss genetic testing, and to further clarify his future cancer risks, as well as potential cancer risks for family members.   In October 2023, at the age of 3, Mr. Searcy was diagnosed with high risk prostate cancer.    CANCER HISTORY:  Oncology History  Malignant neoplasm of prostate ([place])  02/22/2022 Initial Diagnosis   Malignant neoplasm of prostate ([place])   02/22/2022 Cancer Staging   Staging form: Prostate, AJCC 8th Edition - Clinical: cT1c, cN0, PSA: 13, Grade Group: 4 - Signed by Wyatt Portela, MD on 02/22/2022 Prostate specific antigen (PSA) range: 10 to 19 Gleason score: 8 Histologic grading system: 5 grade system      Past Medical History:  Diagnosis Date   Acute respiratory failure with hypercapnia ([place]) 05/11/2021   Heart failure with reduced ejection fraction ([place])    Hepatocellular injury 05/17/2021   Hypertension    Obesity, Class III, BMI 40-49.9 (morbid obesity) ([place])    Sleep apnea    Type 2 diabetes mellitus ([place])     Past Surgical History:  Procedure Laterality Date   HERNIA REPAIR      Social History   Socioeconomic History   Marital status: Single    Spouse name: Not on file   Number of children: Not on file   Years of education: Not on file   Highest education level: Not on file  Occupational History   Not on file  Tobacco Use   Smoking status: Every Day    Packs/day: 0.10    Types: Cigarettes   Smokeless tobacco: Never   Tobacco comments:    2 - 3 per day   Substance and Sexual Activity   Alcohol  use: Not Currently    Comment: very rarely   Drug use: Never   Sexual activity: Not on file  Other Topics Concern   Not on file  Social History Narrative   Not on file   Social Determinants of Health   Financial Resource Strain: Not on file  Food Insecurity: Not on file  Transportation Needs: Not on file  Physical Activity: Not on file  Stress: Not on file  Social Connections: Not on file     FAMILY HISTORY:  Mr. Gander reports no family history of cancer. Mr. Trebilcock is unaware of previous family history of genetic testing for hereditary cancer risks. There is no reported Ashkenazi Jewish ancestry.    GENETIC COUNSELING ASSESSMENT: Mr. Knutzen is a 61 y.o. male with a personal history of cancer which is somewhat suggestive of a hereditary predisposition to cancer given his personal history of high risk prostate cancer. We, therefore, discussed and recommended the following at today's visit.   DISCUSSION: We discussed that 5 - 10% of cancer is hereditary, with most cases of prostate cancer associated with BRCA1/2.  There are other genes that can be associated with hereditary prostate cancer syndromes.  We discussed that testing is beneficial for several reasons including knowing how to follow individuals after completing their treatment, identifying whether potential treatment options would  be beneficial, and understanding if other family members could be at risk for cancer and allowing them to undergo genetic testing.   We reviewed the characteristics, features and inheritance patterns of hereditary cancer syndromes. We also discussed genetic testing, including the appropriate family members to test, the process of testing, insurance coverage and turn-around-time for results. We discussed the implications of a negative, positive, carrier and/or variant of uncertain significant result. We recommended Mr. Lucarelli pursue genetic testing for a panel that includes genes associated with prostate  cancer.   Based on Mr. Klug personal history of cancer, he meets medical criteria for genetic testing. Despite that he meets criteria, he may still have an out of pocket cost. We discussed that if his out of pocket cost for testing is over $100, the laboratory will call and confirm whether he wants to proceed with testing.  If the out of pocket cost of testing is less than $100 he will be billed by the genetic testing laboratory.   PLAN: Despite our recommendation, Mr. Laura did not wish to pursue genetic testing at today's visit. We understand this decision and remain available to coordinate genetic testing at any time in the future. We, therefore, recommend Mr. Tackitt continue to follow the cancer screening guidelines given by his primary healthcare provider.  Mr. Margraf questions were answered to his satisfaction today. Our contact information was provided should additional questions or concerns arise. Thank you for the referral and allowing [place] to share in the care of your patient.   Lucille Passy, MS, [facility] Genetic Counselor [email or web address] (P) [phone]  The patient was seen for a total of 20 minutes in face-to-face genetic counseling.  The patient brought his wife. Drs. Lindi Adie and/or Burr Medico were available to discuss this case as needed.   _______________________________________________________________________ For Office Staff:  Number of people involved in session: 2 Was an Intern/ student involved with case: no

## 2022-02-22 NOTE — Progress Notes (Signed)
Reason for the request:    Prostate cancer  HPI: I was asked by Dr. Junious Silk to evaluate Jason Mccarty for evaluation of prostate cancer.  Is a 61 year old man found to have an elevated PSA of 13 and underwent MRI of the prostate on December 25, 2021.  The MRI showed a PI-RADS category 5 lesion in the left peripheral zone and a PI-RADS 4 in the right peripheral zone.  He underwent a prostate biopsy on January 30, 2022.  Which showed high-volume disease with Gleason score of 4+4 equal 8 in 12 out of 12 cores.  His PSMA PET scan has not been completed at this time.  Clinically, he reports urinary frequency but no urgency or frequency.  He does report some occasional nocturia.  He remains reasonably active without any decline in his energy or performance status.   He does not report any headaches, blurry vision, syncope or seizures. Does not report any fevers, chills or sweats.  Does not report any cough, wheezing or hemoptysis.  Does not report any chest pain, palpitation, orthopnea or leg edema.  Does not report any nausea, vomiting or abdominal pain.  Does not report any constipation or diarrhea.  Does not report any skeletal complaints.    Does not report frequency, urgency or hematuria.  Does not report any skin rashes or lesions. Does not report any heat or cold intolerance.  Does not report any lymphadenopathy or petechiae.  Does not report any anxiety or depression.  Remaining review of systems is negative.     Past Medical History:  Diagnosis Date   Acute respiratory failure with hypercapnia ([place]) 05/11/2021   Heart failure with reduced ejection fraction ([place])    Hepatocellular injury 05/17/2021   Hypertension    Obesity, Class III, BMI 40-49.9 (morbid obesity) ([place])    Sleep apnea    Type 2 diabetes mellitus ([place])   :   Past Surgical History:  Procedure Laterality Date   HERNIA REPAIR    :   Current Outpatient Medications:    Accu-Chek FastClix Lancets MISC, USE AS DIRECTED, Disp:  102 each, Rfl: 1   amLODipine (NORVASC) 10 MG tablet, Take 1 tablet (10 mg total) by mouth daily., Disp: 60 tablet, Rfl: 2   aspirin EC 81 MG tablet, Take 1 tablet (81 mg total) by mouth daily. Once a week, Disp: 30 tablet, Rfl: 3   atorvastatin (LIPITOR) 40 MG tablet, Take 1 tablet (40 mg total) by mouth daily., Disp: 60 tablet, Rfl: 1   benzonatate (TESSALON) 100 MG capsule, Take 1 capsule (100 mg total) by mouth every 8 (eight) hours., Disp: 21 capsule, Rfl: 0   blood glucose meter kit and supplies KIT, Use up to one time daily as directed., Disp: 1 each, Rfl: 0   carvedilol (COREG) 6.25 MG tablet, TAKE 1 TABLET BY MOUTH TWICE DAILY WITH A MEAL, Disp: 120 tablet, Rfl: 0   dapagliflozin propanediol (FARXIGA) 5 MG TABS tablet, Take 1 tablet (5 mg total) by mouth daily before breakfast., Disp: 30 tablet, Rfl: 2   fluticasone (FLONASE) 50 MCG/ACT nasal spray, Place 1 spray into both nostrils daily., Disp: 16 g, Rfl: 0   furosemide (LASIX) 20 MG tablet, Take 1 tablet (20 mg total) by mouth daily., Disp: 60 tablet, Rfl: 1   glucose blood test strip, Use up to 1 time daily., Disp: 30 each, Rfl: 11   metFORMIN (GLUCOPHAGE) 500 MG tablet, Take 1 tablet by mouth once daily with breakfast, Disp: 30 tablet, Rfl:  11   sildenafil (VIAGRA) 50 MG tablet, Take 1 tablet (50 mg total) by mouth as needed for erectile dysfunction. Take 1 hour before sexual activity. If you experience headache, reduce to half tablet., Disp: 10 tablet, Rfl: 0   spironolactone (ALDACTONE) 25 MG tablet, Take 1 tablet (25 mg total) by mouth daily., Disp: 30 tablet, Rfl: 11   tamsulosin (FLOMAX) 0.4 MG CAPS capsule, Take 1 capsule (0.4 mg total) by mouth daily after breakfast., Disp: 30 capsule, Rfl: 1:   Allergies  Allergen Reactions   Benadryl [Diphenhydramine] Itching  :   Family History  Problem Relation Age of Onset   Heart failure Father    Hypertension Father   :   Social History   Socioeconomic History   Marital  status: Single    Spouse name: Not on file   Number of children: Not on file   Years of education: Not on file   Highest education level: Not on file  Occupational History   Not on file  Tobacco Use   Smoking status: Every Day    Packs/day: 0.10    Types: Cigarettes   Smokeless tobacco: Never   Tobacco comments:    2 - 3 per day   Substance and Sexual Activity   Alcohol use: Not Currently    Comment: very rarely   Drug use: Never   Sexual activity: Not on file  Other Topics Concern   Not on file  Social History Narrative   Not on file   Social Determinants of Health   Financial Resource Strain: Not on file  Food Insecurity: Not on file  Transportation Needs: Not on file  Physical Activity: Not on file  Stress: Not on file  Social Connections: Not on file  Intimate Partner Violence: Not on file  :  Pertinent items are noted in HPI.  Exam:  General appearance: alert and cooperative appeared without distress. Head: atraumatic without any abnormalities. Eyes: conjunctivae/corneas clear. PERRL.  Sclera anicteric. Throat: lips, mucosa, and tongue normal; without oral thrush or ulcers. Resp: clear to auscultation bilaterally without rhonchi, wheezes or dullness to percussion. Cardio: regular rate and rhythm, S1, S2 normal, no murmur, click, rub or gallop GI: soft, non-tender; bowel sounds normal; no masses,  no organomegaly Skin: Skin color, texture, turgor normal. No rashes or lesions Lymph nodes: Cervical, supraclavicular, and axillary nodes normal. Neurologic: Grossly normal without any motor, sensory or deep tendon reflexes. Musculoskeletal: No joint deformity or effusion.    Assessment and Plan:    61 year old with prostate cancer diagnosed in October 2023.  He was found to have PSA of 13 and a Gleason score of 8 and high-volume high risk disease.  PSMA PET scan currently pending.  MRI of the prostate did not show any evidence of pelvic adenopathy or metastatic  disease.  His case was discussed today in the prostate cancer multidisciplinary clinic including review of his imaging studies with radiology and discussing his pathology results with the reviewing pathologist.  Pending the results of his PSMA PET scan treatment options were reviewed.  If he has no metastatic disease at this time, treatment options would be primary surgical therapy as of potentially part of a trimodality approach versus androgen deprivation therapy followed by radiation with duration of androgen deprivation therapy for at least 6 to 24 weeks.  Therapy escalation with androgen receptor pathway inhibitors could be considered he has disease outside of the prostate.  If he has metastatic disease on his PSMA PET scan  local therapy could be considered if his disease outside of the prostate is limited in treating the primary prostate with oligometastatic disease is considered.  Therapy escalation would be recommended at this time with adding abiraterone to androgen deprivation therapy.  30  minutes were dedicated to this visit. The time was spent on reviewing laboratory data, imaging studies, discussing treatment options, discussing pathology results and answering questions regarding future plan.      A copy of this consult has been forwarded to the requesting physician.

## 2022-02-26 ENCOUNTER — Other Ambulatory Visit: Payer: Self-pay

## 2022-02-26 DIAGNOSIS — I10 Essential (primary) hypertension: Secondary | ICD-10-CM

## 2022-02-26 DIAGNOSIS — I5022 Chronic systolic (congestive) heart failure: Secondary | ICD-10-CM

## 2022-02-27 ENCOUNTER — Other Ambulatory Visit (HOSPITAL_COMMUNITY): Payer: Self-pay | Admitting: Urology

## 2022-02-27 ENCOUNTER — Encounter: Payer: Self-pay | Admitting: Student

## 2022-02-27 ENCOUNTER — Ambulatory Visit (INDEPENDENT_AMBULATORY_CARE_PROVIDER_SITE_OTHER): Payer: Commercial Managed Care - PPO | Admitting: Student

## 2022-02-27 VITALS — BP 143/74 | HR 64 | Ht 71.0 in | Wt 306.4 lb

## 2022-02-27 DIAGNOSIS — C61 Malignant neoplasm of prostate: Secondary | ICD-10-CM

## 2022-02-27 DIAGNOSIS — F17210 Nicotine dependence, cigarettes, uncomplicated: Secondary | ICD-10-CM

## 2022-02-27 DIAGNOSIS — I10 Essential (primary) hypertension: Secondary | ICD-10-CM

## 2022-02-27 DIAGNOSIS — E1122 Type 2 diabetes mellitus with diabetic chronic kidney disease: Secondary | ICD-10-CM | POA: Diagnosis not present

## 2022-02-27 DIAGNOSIS — I130 Hypertensive heart and chronic kidney disease with heart failure and stage 1 through stage 4 chronic kidney disease, or unspecified chronic kidney disease: Secondary | ICD-10-CM

## 2022-02-27 DIAGNOSIS — N1831 Chronic kidney disease, stage 3a: Secondary | ICD-10-CM | POA: Diagnosis not present

## 2022-02-27 DIAGNOSIS — Z7984 Long term (current) use of oral hypoglycemic drugs: Secondary | ICD-10-CM

## 2022-02-27 DIAGNOSIS — I5022 Chronic systolic (congestive) heart failure: Secondary | ICD-10-CM | POA: Diagnosis not present

## 2022-02-27 LAB — POCT GLYCOSYLATED HEMOGLOBIN (HGB A1C): Hemoglobin A1C: 8.7 % — AB (ref 4.0–5.6)

## 2022-02-27 LAB — GLUCOSE, CAPILLARY: Glucose-Capillary: 185 mg/dL — ABNORMAL HIGH (ref 70–99)

## 2022-02-27 MED ORDER — TAMSULOSIN HCL 0.4 MG PO CAPS: 0.4000 mg | ORAL_CAPSULE | Freq: Every day | ORAL | 1 refills | Status: DC

## 2022-02-27 MED ORDER — ATORVASTATIN CALCIUM 40 MG PO TABS: 40.0000 mg | ORAL_TABLET | Freq: Every day | ORAL | 3 refills | Status: DC

## 2022-02-27 MED ORDER — FUROSEMIDE 20 MG PO TABS: 20.0000 mg | ORAL_TABLET | Freq: Every day | ORAL | 1 refills | Status: DC

## 2022-02-27 MED ORDER — CARVEDILOL 6.25 MG PO TABS: 6.2500 mg | ORAL_TABLET | Freq: Two times a day (BID) | ORAL | 1 refills | Status: DC

## 2022-02-27 MED ORDER — METFORMIN HCL ER (MOD) 1000 MG PO TB24: 1000.0000 mg | ORAL_TABLET | Freq: Every day | ORAL | 1 refills | Status: DC

## 2022-02-27 MED ORDER — DAPAGLIFLOZIN PROPANEDIOL 5 MG PO TABS
5.0000 mg | ORAL_TABLET | Freq: Every day | ORAL | 2 refills | Status: DC
Start: 2022-02-27 — End: 2022-06-06

## 2022-02-27 MED ORDER — FLUTICASONE PROPIONATE 50 MCG/ACT NA SUSP: 1.0000 | Freq: Every day | NASAL | 0 refills | Status: DC

## 2022-02-27 MED ORDER — SOLIFENACIN SUCCINATE 5 MG PO TABS: 5.0000 mg | ORAL_TABLET | Freq: Every day | ORAL | 1 refills | Status: DC

## 2022-02-27 MED ORDER — METFORMIN HCL 500 MG PO TABS: 500.0000 mg | ORAL_TABLET | Freq: Every day | ORAL | 3 refills | Status: DC

## 2022-02-27 MED ORDER — SPIRONOLACTONE 25 MG PO TABS: 25.0000 mg | ORAL_TABLET | Freq: Every day | ORAL | 3 refills | Status: DC

## 2022-02-27 MED ORDER — AMLODIPINE BESYLATE 10 MG PO TABS: 10.0000 mg | ORAL_TABLET | Freq: Every day | ORAL | 2 refills | Status: DC

## 2022-02-27 NOTE — Progress Notes (Signed)
   CC: f/u T2DM, HF, med refill  HPI:  Mr.Nadia Guia is a 61 y.o. male with history listed below presenting to the [facility] for f/u T2DM, HF, med refill. Please see individualized problem based charting for full HPI.  Past Medical History:  Diagnosis Date   Acute respiratory failure with hypercapnia ([place]) 05/11/2021   Heart failure with reduced ejection fraction ([place])    Hepatocellular injury 05/17/2021   Hypertension    Obesity, Class III, BMI 40-49.9 (morbid obesity) ([place])    Sleep apnea    Type 2 diabetes mellitus ([place])     Review of Systems:  Negative aside from that listed in individualized problem based charting.  Physical Exam:  Vitals:   02/27/22 1522  BP: (!) 143/65  Pulse: 91  SpO2: 96%  Weight: (!) 306 lb 6.4 oz (139 kg)  Height: 5' 11"  (1.803 m)   Physical Exam Constitutional:      Appearance: Normal appearance. He is obese. He is not ill-appearing.  HENT:     Mouth/Throat:     Mouth: Mucous membranes are moist.     Pharynx: Oropharynx is clear.  Eyes:     Extraocular Movements: Extraocular movements intact.     Conjunctiva/sclera: Conjunctivae normal.     Pupils: Pupils are equal, round, and reactive to light.  Cardiovascular:     Rate and Rhythm: Normal rate and regular rhythm.     Pulses: Normal pulses.     Heart sounds: Normal heart sounds. No murmur heard.    No friction rub. No gallop.     Comments: No appreciable JVD noted Pulmonary:     Effort: Pulmonary effort is normal.     Breath sounds: Normal breath sounds. No wheezing, rhonchi or rales.  Abdominal:     General: Bowel sounds are normal. There is no distension.     Palpations: Abdomen is soft.     Tenderness: There is no abdominal tenderness.  Musculoskeletal:        General: Normal range of motion.     Comments: 1-2+ pitting edema up to knees bilaterally.  Skin:    General: Skin is warm and dry.  Neurological:     General: No focal deficit present.     Mental Status: He is alert and  oriented to person, place, and time.  Psychiatric:        Mood and Affect: Mood normal.        Behavior: Behavior normal.      Assessment & Plan:   See Encounters Tab for problem based charting.  Patient discussed with Dr.  Jimmye Norman

## 2022-02-27 NOTE — Assessment & Plan Note (Signed)
Living with chronic HFrEF (EF 40-45%). GDMT includes coreg, farxiga, spironolactone, and lasix. He has not used his lasix in the past two weeks because he has been focused on his recent diagnosis of prostate cancer. He does have 1-2+ pitting edema in bilateral LE up to knees on exam today. Otherwise, no crackles or JVD noted and extremities are warm and well-perfused. Will have him take lasix 87m daily for 2-3 days (depending on swelling), then transition back to 263mdaily thereafter.  Plan: -continue coreg, farxiga, spiro -lasix 4072maily x2-3 days, then lasix 62m43mily -f/u in 1 month with me

## 2022-02-27 NOTE — Assessment & Plan Note (Addendum)
Patient with recent diagnosis of biopsy-confirmed prostate cancer. He has been following urology and oncology to develop a good plan for management. He is scheduled to perform a PET scan on 11/30 for staging purposes and to guide subsequent treatment strategy. Will follow up with oncology and urology afterwards.   This new diagnosis has been hard on him. He is appropriately placing more concern on this but at the same time focusing less on comorbid conditions. I do believe that treatment of prostate cancer will allow for better management/focus of his comorbid conditions.  Of note, oncology would like for patient to receive cardiology evaluation and clearance prior to any upcoming surgical interventions given HFrEF and recent hypertensive emergency and NSTEMI (05/2021). Placed cardiology referral.

## 2022-02-27 NOTE — Patient Instructions (Addendum)
Mr. Kistler,  It was a pleasure seeing you in the clinic today.   Please start taking two tablets of metformin every day. I have sent refills to the mail pharmacy. Please take 2 tablets of your water pill (furosemide) for the next 2 days, then start taking 1 tablet every day afterwards. I have refilled your medicines and sent them all to the mail pharmacy (aside from the [place] and the solifenacin). Please come back to see [place] in 3 weeks for follow up.  Please call our clinic at [phone] if you have any questions or concerns. The best time to call is Monday-Friday from 9am-4pm, but there is someone available 24/7 at the same number. If you need medication refills, please notify your pharmacy one week in advance and they will send [place] a request.   Thank you for letting us take part in your care. We look forward to seeing you next time!

## 2022-02-27 NOTE — Addendum Note (Signed)
Addended by: Virl Axe on: 02/27/2022 05:46 PM   Modules accepted: Orders

## 2022-02-27 NOTE — Assessment & Plan Note (Signed)
Patient with T2DM, currently taking metformin 536m every morning along with farxiga 530mdaily. A1c today has increased from 7.9 to 8.7%. He endorses that his motivation for better glycemic control has decreased after his recent diagnosis of biopsy-confirmed prostate cancer. We discussed needing better glycemic control especially in regards to potential future surgical interventions and wound healing thereafter. He understands. He has historically been hesitant to make big changes in his medications but we agreed to increase his metformin to 100047maily (switched to metformin-XR). He will try his best to eat healthier and avoid sugary foods. I do believe that we will be able to obtain better glycemic control after we are able to determine a good plan for treatment of his prostate cancer.  Plan: -switched to metformin-XR and increased to 1000m15mily -continue farxiga -f/u in 1 month with me

## 2022-03-01 ENCOUNTER — Telehealth: Payer: Self-pay | Admitting: *Deleted

## 2022-03-01 MED ORDER — METFORMIN HCL 500 MG PO TABS: 1000.0000 mg | ORAL_TABLET | Freq: Every day | ORAL | 1 refills | Status: DC

## 2022-03-01 NOTE — Telephone Encounter (Signed)
Received call from [facility] at [place] stating Metformin XR 1000 mg is not covered by insurance but Metformin ER 500 mg 2 tabs (1000 mg total) is covered. If acceptable, please send new Rx.

## 2022-03-10 NOTE — Progress Notes (Signed)
Internal Medicine Clinic Attending  Case discussed with Dr. Jinwala  At the time of the visit.  We reviewed the resident's history and exam and pertinent patient test results.  I agree with the assessment, diagnosis, and plan of care documented in the resident's note.  

## 2022-03-13 NOTE — Addendum Note (Signed)
Addended by: Virl Axe on: 03/13/2022 01:16 PM   Modules accepted: Orders

## 2022-03-14 ENCOUNTER — Ambulatory Visit (HOSPITAL_COMMUNITY)
Admission: RE | Admit: 2022-03-14 | Discharge: 2022-03-14 | Disposition: A | Discharge status: Home / Self Care | Payer: Commercial Managed Care - PPO | Source: Ambulatory Visit | Attending: Urology | Admitting: Urology

## 2022-03-14 DIAGNOSIS — C61 Malignant neoplasm of prostate: Secondary | ICD-10-CM | POA: Insufficient documentation

## 2022-03-14 MED ORDER — PIFLIFOLASTAT F 18 (PYLARIFY) INJECTION
9.0000 | Freq: Once | INTRAVENOUS | Status: AC
  Administered 2022-03-14: 8.8 via INTRAVENOUS

## 2022-03-19 ENCOUNTER — Ambulatory Visit: Payer: Commercial Managed Care - PPO | Attending: Cardiology | Admitting: Cardiology

## 2022-03-19 ENCOUNTER — Encounter: Payer: Self-pay | Admitting: Cardiology

## 2022-03-19 VITALS — BP 142/70 | HR 74 | Ht 71.0 in | Wt 302.6 lb

## 2022-03-19 DIAGNOSIS — N183 Chronic kidney disease, stage 3 unspecified: Secondary | ICD-10-CM

## 2022-03-19 DIAGNOSIS — I5020 Unspecified systolic (congestive) heart failure: Secondary | ICD-10-CM | POA: Diagnosis not present

## 2022-03-19 DIAGNOSIS — Z79899 Other long term (current) drug therapy: Secondary | ICD-10-CM

## 2022-03-19 DIAGNOSIS — G4733 Obstructive sleep apnea (adult) (pediatric): Secondary | ICD-10-CM

## 2022-03-19 DIAGNOSIS — I110 Hypertensive heart disease with heart failure: Secondary | ICD-10-CM | POA: Diagnosis not present

## 2022-03-19 DIAGNOSIS — E1122 Type 2 diabetes mellitus with diabetic chronic kidney disease: Secondary | ICD-10-CM

## 2022-03-19 LAB — COMPREHENSIVE METABOLIC PANEL
ALT: 19 IU/L (ref 0–44)
AST: 15 IU/L (ref 0–40)
Albumin/Globulin Ratio: 1.4 (ref 1.2–2.2)
Albumin: 3.8 g/dL — ABNORMAL LOW (ref 3.9–4.9)
Alkaline Phosphatase: 84 IU/L (ref 44–121)
BUN/Creatinine Ratio: 15 (ref 10–24)
BUN: 21 mg/dL (ref 8–27)
Bilirubin Total: 0.2 mg/dL (ref 0.0–1.2)
CO2: 22 mmol/L (ref 20–29)
Calcium: 8.4 mg/dL — ABNORMAL LOW (ref 8.6–10.2)
Chloride: 101 mmol/L (ref 96–106)
Creatinine, Ser: 1.43 mg/dL — ABNORMAL HIGH (ref 0.76–1.27)
Globulin, Total: 2.7 g/dL (ref 1.5–4.5)
Glucose: 206 mg/dL — ABNORMAL HIGH (ref 70–99)
Potassium: 3.6 mmol/L (ref 3.5–5.2)
Sodium: 137 mmol/L (ref 134–144)
Total Protein: 6.5 g/dL (ref 6.0–8.5)
eGFR: 55 mL/min/{1.73_m2} — ABNORMAL LOW (ref >59)

## 2022-03-19 LAB — MAGNESIUM: Magnesium: 1.7 mg/dL (ref 1.6–2.3)

## 2022-03-19 NOTE — Patient Instructions (Signed)
Medication Instructions:  Your physician recommends that you continue on your current medications as directed. Please refer to the Current Medication list given to you today.  *If you need a refill on your cardiac medications before your next appointment, please call your pharmacy*   Lab Work: Your physician recommends that you have the following labs drawn today: CMET and Magnesium  If you have labs (blood work) drawn today and your tests are completely normal, you will receive your results only by: [place] (if you have MyChart) OR A paper copy in the mail If you have any lab test that is abnormal or we need to change your treatment, we will call you to review the results.   Testing/Procedures: Your physician has requested that you have an echocardiogram. Echocardiography is a painless test that uses sound waves to create images of your heart. It provides your doctor with information about the size and shape of your heart and how well your heart's chambers and valves are working. This procedure takes approximately one hour. There are no restrictions for this procedure. Please do NOT wear cologne, perfume, aftershave, or lotions (deodorant is allowed). Please arrive 15 minutes prior to your appointment time.    Follow-Up: At [facility], you and your health needs are our priority.  As part of our continuing mission to provide you with exceptional heart care, we have created designated Provider Care Teams.  These Care Teams include your primary Cardiologist (physician) and Advanced Practice Providers (APPs -  Physician Assistants and Nurse Practitioners) who all work together to provide you with the care you need, when you need it.  We recommend signing up for the patient portal called "MyChart".  Sign up information is provided on this After Visit Summary.  MyChart is used to connect with patients for Virtual Visits (Telemedicine).  Patients are able to view lab/test results,  encounter notes, upcoming appointments, etc.  Non-urgent messages can be sent to your provider as well.   To learn more about what you can do with MyChart, go to [email or web address].    Your next appointment:   3 month(s)  The format for your next appointment:   In Person  Provider:   Berniece Salines, DO

## 2022-03-19 NOTE — Progress Notes (Signed)
Cardiology Office Note:    Date:  03/19/2022   ID:  Jason Mccarty, DOB 1960-04-22, MRN [id number]  PCP:  Virl Axe, MD  Cardiologist:  Berniece Salines, DO  Electrophysiologist:  None   Referring MD: Virl Axe, MD     I am   History of Present Illness:    Jason Mccarty is a 61 y.o. male with a hx of prostate cancer has been followed by urology recently had a PET scan treatment plan is pending, heart failure with reduced ejection fraction EF in January 40 to 45%, history of NSTEMI in February 2023, hypertensive heart disease, sleep apnea, type 2 diabetes, morbid obesity.  Today he offers no complaints.  He notes that his PCP had asked him to see cardiology recently because of the uncontrolled pressure as well as the fact that he had not seen cardiology since his hospitalization.  He denies any chest pain or shortness of breath.  Past Medical History:  Diagnosis Date   Acute respiratory failure with hypercapnia ([place]) 05/11/2021   Heart failure with reduced ejection fraction ([place])    Hepatocellular injury 05/17/2021   Hypertension    Obesity, Class III, BMI 40-49.9 (morbid obesity) ([place])    Sleep apnea    Type 2 diabetes mellitus ([place])     Past Surgical History:  Procedure Laterality Date   HERNIA REPAIR      Current Medications: Current Meds  Medication Sig   Accu-Chek FastClix Lancets MISC USE AS DIRECTED   amLODipine (NORVASC) 10 MG tablet Take 1 tablet (10 mg total) by mouth daily.   aspirin EC 81 MG tablet Take 1 tablet (81 mg total) by mouth daily. Once a week   atorvastatin (LIPITOR) 40 MG tablet Take 1 tablet (40 mg total) by mouth daily.   blood glucose meter kit and supplies KIT Use up to one time daily as directed.   carvedilol (COREG) 6.25 MG tablet Take 1 tablet (6.25 mg total) by mouth 2 (two) times daily with a meal.   dapagliflozin propanediol (FARXIGA) 5 MG TABS tablet Take 1 tablet (5 mg total) by mouth daily before breakfast.   fluticasone (FLONASE) 50  MCG/ACT nasal spray Place 1 spray into both nostrils daily.   furosemide (LASIX) 20 MG tablet Take 1 tablet (20 mg total) by mouth daily.   glucose blood test strip Use up to 1 time daily.   metFORMIN (GLUCOPHAGE) 500 MG tablet Take 2 tablets (1,000 mg total) by mouth daily with breakfast.   Multiple Vitamins-Minerals (MENS MULTIVITAMIN PLUS PO) Take by mouth daily.   sildenafil (VIAGRA) 50 MG tablet Take 1 tablet (50 mg total) by mouth as needed for erectile dysfunction. Take 1 hour before sexual activity. If you experience headache, reduce to half tablet.   solifenacin (VESICARE) 5 MG tablet Take 1 tablet (5 mg total) by mouth daily.   spironolactone (ALDACTONE) 25 MG tablet Take 1 tablet (25 mg total) by mouth daily.   tamsulosin (FLOMAX) 0.4 MG CAPS capsule Take 1 capsule (0.4 mg total) by mouth daily after breakfast.     Allergies:   Benadryl [diphenhydramine]   Social History   Socioeconomic History   Marital status: Single    Spouse name: Not on file   Number of children: Not on file   Years of education: Not on file   Highest education level: Not on file  Occupational History   Not on file  Tobacco Use   Smoking status: Every Day    Packs/day: 0.10  Types: Cigarettes   Smokeless tobacco: Never   Tobacco comments:    2 - 3 per day   Substance and Sexual Activity   Alcohol use: Not Currently    Comment: very rarely   Drug use: Never   Sexual activity: Not on file  Other Topics Concern   Not on file  Social History Narrative   Not on file   Social Determinants of Health   Financial Resource Strain: Not on file  Food Insecurity: Not on file  Transportation Needs: Not on file  Physical Activity: Not on file  Stress: Not on file  Social Connections: Not on file     Family History: The patient's family history includes Heart failure in his father; Hypertension in his father.  ROS:   Review of Systems  Constitution: Negative for decreased appetite, fever and  weight gain.  HENT: Negative for congestion, ear discharge, hoarse voice and sore throat.   Eyes: Negative for discharge, redness, vision loss in right eye and visual halos.  Cardiovascular: Negative for chest pain, dyspnea on exertion, leg swelling, orthopnea and palpitations.  Respiratory: Negative for cough, hemoptysis, shortness of breath and snoring.   Endocrine: Negative for heat intolerance and polyphagia.  Hematologic/Lymphatic: Negative for bleeding problem. Does not bruise/bleed easily.  Skin: Negative for flushing, nail changes, rash and suspicious lesions.  Musculoskeletal: Negative for arthritis, joint pain, muscle cramps, myalgias, neck pain and stiffness.  Gastrointestinal: Negative for abdominal pain, bowel incontinence, diarrhea and excessive appetite.  Genitourinary: Negative for decreased libido, genital sores and incomplete emptying.  Neurological: Negative for brief paralysis, focal weakness, headaches and loss of balance.  Psychiatric/Behavioral: Negative for altered mental status, depression and suicidal ideas.  Allergic/Immunologic: Negative for HIV exposure and persistent infections.    EKGs/Labs/Other Studies Reviewed:    The following studies were reviewed today:   EKG: None today  TTE 05/13/2021 IMPRESSIONS   1. Left ventricular ejection fraction, by estimation, is 40 to 45%. The  left ventricle has mildly decreased function. The left ventricle  demonstrates regional wall motion abnormalities (see scoring  diagram/findings for description). There is severe  concentric left ventricular hypertrophy. Left ventricular diastolic  parameters are indeterminate. Elevated left ventricular end-diastolic  pressure.   2. Right ventricular systolic function was not well visualized. The right  ventricular size is normal. Tricuspid regurgitation signal is inadequate  for assessing PA pressure.   3. The mitral valve is normal in structure. Trivial mitral valve   regurgitation. No evidence of mitral stenosis.   4. The aortic valve is normal in structure. Aortic valve regurgitation is  not visualized. No aortic stenosis is present.   5. There is borderline dilatation of the ascending aorta, measuring 37  mm.   6. The inferior vena cava is normal in size with greater than 50%  respiratory variability, suggesting right atrial pressure of 3 mmHg.   FINDINGS   Left Ventricle: Left ventricular ejection fraction, by estimation, is 40  to 45%. The left ventricle has mildly decreased function. The left  ventricle demonstrates regional wall motion abnormalities. The left  ventricular internal cavity size was normal  in size. There is severe concentric left ventricular hypertrophy. Left  ventricular diastolic parameters are indeterminate. Elevated left  ventricular end-diastolic pressure.   Right Ventricle: The right ventricular size is normal. No increase in  right ventricular wall thickness. Right ventricular systolic function was  not well visualized. Tricuspid regurgitation signal is inadequate for  assessing PA pressure.   Left Atrium: Left  atrial size was not well visualized.   Right Atrium: Right atrial size was not well visualized.   Pericardium: There is no evidence of pericardial effusion.   Mitral Valve: The mitral valve is normal in structure. Trivial mitral  valve regurgitation. No evidence of mitral valve stenosis.   Tricuspid Valve: The tricuspid valve is normal in structure. Tricuspid  valve regurgitation is not demonstrated. No evidence of tricuspid  stenosis.   Aortic Valve: The aortic valve is normal in structure. Aortic valve  regurgitation is not visualized. No aortic stenosis is present. Aortic  valve mean gradient measures 2.0 mmHg. Aortic valve peak gradient measures  4.8 mmHg. Aortic valve area, by VTI  measures 3.58 cm.   Pulmonic Valve: The pulmonic valve was normal in structure. Pulmonic valve  regurgitation is  not visualized. No evidence of pulmonic stenosis.   Aorta: The aortic root is normal in size and structure. There is  borderline dilatation of the ascending aorta, measuring 37 mm.   Venous: The inferior vena cava is normal in size with greater than 50%  respiratory variability, suggesting right atrial pressure of 3 mmHg.   IAS/Shunts: The interatrial septum appears to be lipomatous. No atrial  level shunt detected by color flow Doppler.   Recent Labs: 05/11/2021: B Natriuretic Peptide 249.8 05/13/2021: Hemoglobin 13.7; Magnesium 2.0; Platelets 220 05/17/2021: ALT 29 11/19/2021: BUN 24; Creatinine, Ser 1.39; Potassium 4.1; Sodium 138  Recent Lipid Panel    Component Value Date/Time   CHOL 144 05/11/2021 0620   TRIG 60 05/11/2021 0620   HDL 34 (L) 05/11/2021 0620   CHOLHDL 4.2 05/11/2021 0620   VLDL 12 05/11/2021 0620   LDLCALC 98 05/11/2021 0620    Physical Exam:    VS:  BP (!) 142/70   Pulse 74   Ht _0  (1.803 m)   Wt (!) 302 lb 9.6 oz (137.3 kg)   SpO2 92%   BMI 42.20 kg/m     Wt Readings from Last 3 Encounters:  03/19/22 (!) 302 lb 9.6 oz (137.3 kg)  02/27/22 (!) 306 lb 6.4 oz (139 kg)  02/22/22 300 lb (136.1 kg)     GEN: Well nourished, well developed in no acute distress HEENT: Normal NECK: No JVD; No carotid bruits LYMPHATICS: No lymphadenopathy CARDIAC: S1S2 noted,RRR, no murmurs, rubs, gallops RESPIRATORY:  Clear to auscultation without rales, wheezing or rhonchi  ABDOMEN: Soft, non-tender, non-distended, +bowel sounds, no guarding. EXTREMITIES: No edema, No cyanosis, no clubbing MUSCULOSKELETAL:  No deformity  SKIN: Warm and dry NEUROLOGIC:  Alert and oriented x 3, non-focal PSYCHIATRIC:  Normal affect, good insight  ASSESSMENT:    1. HFrEF (heart failure with reduced ejection fraction) ([place])   2. Medication management   3. Hypertensive heart disease with heart failure (HCC)   4. Stage 3 chronic kidney disease, unspecified whether stage 3a or 3b CKD  ([place])   5. Type 2 diabetes mellitus with chronic kidney disease, without long-term current use of insulin, unspecified CKD stage ([place])   6. Morbid obesity ([place])   7. OSA (obstructive sleep apnea)    PLAN:     He does have heart failure with reduced ejection fraction his EF in January 2023 was 40-45% and at that time he did have severe left ventricular hypertrophy as well.  He has been started on medications currently for guideline medical therapy he is on carvedilol 6.25 mg twice daily, Farxiga 5 mg daily, spironolactone 25 mg daily.  Unclear why he is not on [facility]  or an ARB.  I like to repeat his echocardiogram to assess his EF.  Also get blood work today to get his kidney function as well as his potassium level if his kidney function and potassium level are acceptable we will start the patient on low-dose Entresto.  He is hypertensive in the office lately he tells me he is not taking any of he is on amlodipine 10 mg daily, carvedilol 6.25 mg twice daily Aldactone 25 mg daily and he takes Lasix 20 mg daily as well.  His target should be less than 130/80 mmHg.  Hopefully if we are able to add Delene Loll this will help as well.  The patient understands the need to lose weight with diet and exercise. We have discussed specific strategies for this.  CKD-avoid nephrotoxins.  The patient is in agreement with the above plan. The patient left the office in stable condition.  The patient will follow up in 3 months or sooner if needed.   Medication Adjustments/Labs and Tests Ordered: Current medicines are reviewed at length with the patient today.  Concerns regarding medicines are outlined above.  Orders Placed This Encounter  Procedures   Comprehensive metabolic panel   Magnesium   ECHOCARDIOGRAM COMPLETE   No orders of the defined types were placed in this encounter.   Patient Instructions  Medication Instructions:  Your physician recommends that you continue on your current medications as  directed. Please refer to the Current Medication list given to you today.  *If you need a refill on your cardiac medications before your next appointment, please call your pharmacy*   Lab Work: Your physician recommends that you have the following labs drawn today: CMET and Magnesium  If you have labs (blood work) drawn today and your tests are completely normal, you will receive your results only by: [place] (if you have MyChart) OR A paper copy in the mail If you have any lab test that is abnormal or we need to change your treatment, we will call you to review the results.   Testing/Procedures: Your physician has requested that you have an echocardiogram. Echocardiography is a painless test that uses sound waves to create images of your heart. It provides your doctor with information about the size and shape of your heart and how well your heart's chambers and valves are working. This procedure takes approximately one hour. There are no restrictions for this procedure. Please do NOT wear cologne, perfume, aftershave, or lotions (deodorant is allowed). Please arrive 15 minutes prior to your appointment time.    Follow-Up: At [facility], you and your health needs are our priority.  As part of our continuing mission to provide you with exceptional heart care, we have created designated Provider Care Teams.  These Care Teams include your primary Cardiologist (physician) and Advanced Practice Providers (APPs -  Physician Assistants and Nurse Practitioners) who all work together to provide you with the care you need, when you need it.  We recommend signing up for the patient portal called "MyChart".  Sign up information is provided on this After Visit Summary.  MyChart is used to connect with patients for Virtual Visits (Telemedicine).  Patients are able to view lab/test results, encounter notes, upcoming appointments, etc.  Non-urgent messages can be sent to your provider as  well.   To learn more about what you can do with MyChart, go to [email or web address].    Your next appointment:   3 month(s)  The format for your next appointment:  In Person  Provider:   Berniece Salines, DO     Adopting a Healthy Lifestyle.  Know what a healthy weight is for you (roughly BMI <25) and aim to maintain this   Aim for 7+ servings of fruits and vegetables daily   65-80+ fluid ounces of water or unsweet tea for healthy kidneys   Limit to max 1 drink of alcohol per day; avoid smoking/tobacco   Limit animal fats in diet for cholesterol and heart health - choose grass fed whenever available   Avoid highly processed foods, and foods high in saturated/trans fats   Aim for low stress - take time to unwind and care for your mental health   Aim for 150 min of moderate intensity exercise weekly for heart health, and weights twice weekly for bone health   Aim for 7-9 hours of sleep daily   When it comes to diets, agreement about the perfect plan isnt easy to find, even among the experts. Experts at the [employer] developed an idea known as the Healthy Eating Plate. Just imagine a plate divided into logical, healthy portions.   The emphasis is on diet quality:   Load up on vegetables and fruits - one-half of your plate: Aim for color and variety, and remember that potatoes dont count.   Go for whole grains - one-quarter of your plate: Whole wheat, barley, wheat berries, quinoa, oats, brown rice, and foods made with them. If you want pasta, go with whole wheat pasta.   Protein power - one-quarter of your plate: Fish, chicken, beans, and nuts are all healthy, versatile protein sources. Limit red meat.   The diet, however, does go beyond the plate, offering a few other suggestions.   Use healthy plant oils, such as olive, canola, soy, corn, sunflower and peanut. Check the labels, and avoid partially hydrogenated oil, which have unhealthy trans fats.    If youre thirsty, drink water. Coffee and tea are good in moderation, but skip sugary drinks and limit milk and dairy products to one or two daily servings.   The type of carbohydrate in the diet is more important than the amount. Some sources of carbohydrates, such as vegetables, fruits, whole grains, and beans-are healthier than others.   Finally, stay active  Signed, Berniece Salines, DO  03/19/2022 8:25 AM    [place]

## 2022-03-21 ENCOUNTER — Telehealth: Payer: Self-pay

## 2022-03-21 ENCOUNTER — Other Ambulatory Visit: Payer: Self-pay | Admitting: Urology

## 2022-03-21 DIAGNOSIS — D380 Neoplasm of uncertain behavior of larynx: Secondary | ICD-10-CM

## 2022-03-21 NOTE — Telephone Encounter (Signed)
RN called patient after verifying identity spoke briefly about decision as to which radiation treatment he wanted to move forward with per Ashlyn Bruning, PA-C.

## 2022-04-02 ENCOUNTER — Ambulatory Visit (INDEPENDENT_AMBULATORY_CARE_PROVIDER_SITE_OTHER): Payer: Commercial Managed Care - PPO

## 2022-04-02 ENCOUNTER — Other Ambulatory Visit: Payer: Self-pay

## 2022-04-02 VITALS — BP 131/68 | HR 69 | Temp 97.8°F | Ht 71.0 in | Wt 302.8 lb

## 2022-04-02 DIAGNOSIS — N1831 Chronic kidney disease, stage 3a: Secondary | ICD-10-CM

## 2022-04-02 DIAGNOSIS — I130 Hypertensive heart and chronic kidney disease with heart failure and stage 1 through stage 4 chronic kidney disease, or unspecified chronic kidney disease: Secondary | ICD-10-CM

## 2022-04-02 DIAGNOSIS — I5022 Chronic systolic (congestive) heart failure: Secondary | ICD-10-CM

## 2022-04-02 DIAGNOSIS — I10 Essential (primary) hypertension: Secondary | ICD-10-CM

## 2022-04-02 DIAGNOSIS — R946 Abnormal results of thyroid function studies: Secondary | ICD-10-CM

## 2022-04-02 DIAGNOSIS — C61 Malignant neoplasm of prostate: Secondary | ICD-10-CM

## 2022-04-02 DIAGNOSIS — E1122 Type 2 diabetes mellitus with diabetic chronic kidney disease: Secondary | ICD-10-CM

## 2022-04-02 DIAGNOSIS — Z794 Long term (current) use of insulin: Secondary | ICD-10-CM

## 2022-04-02 DIAGNOSIS — F17210 Nicotine dependence, cigarettes, uncomplicated: Secondary | ICD-10-CM

## 2022-04-02 MED ORDER — ENTRESTO 24-26 MG PO TABS: 1.0000 | ORAL_TABLET | Freq: Two times a day (BID) | ORAL | 0 refills | Status: DC

## 2022-04-02 NOTE — Assessment & Plan Note (Signed)
BP at goal today, will continue coreg, norvasc, farxiga, spironolactone, lasix.

## 2022-04-02 NOTE — Assessment & Plan Note (Signed)
Since last appointment, has had PET scan which showed localized disease in left prostate with exception of possible uptake in left thyroid.  MRI with contrast was suggested to further assess.  Will touch base with his oncologist and follow-up on this.  He mentions he has planned radiation treatments in the future but has not started yet.  He will likely need time off work/disability to go through these treatments and manage his remaining health problems.

## 2022-04-02 NOTE — Progress Notes (Signed)
   CC: Routine follow-up  HPI:  Jason Mccarty is a 61 y.o.-year-old male with past medical history as below presenting for follow-up of HFrEF, HTN, T2DM.  Please see encounters tab for problem-based charting.  Past Medical History:  Diagnosis Date   Acute respiratory failure with hypercapnia ([place]) 05/11/2021   Heart failure with reduced ejection fraction ([place])    Hepatocellular injury 05/17/2021   Hypertension    Obesity, Class III, BMI 40-49.9 (morbid obesity) ([place])    Sleep apnea    Type 2 diabetes mellitus ([place])    Review of Systems: As in HPI.  Please see encounters tab for problem based charting.  Physical Exam:  Vitals:   04/02/22 1511  BP: 131/68  Pulse: 69  Temp: 97.8 F (36.6 C)  TempSrc: Oral  SpO2: 99%  Weight: (!) 302 lb 12.8 oz (137.3 kg)  Height: _0  (1.803 m)   General:Well-appearing, pleasant, In NAD Cardiac: RRR, no murmurs rubs or gallops. Respiratory: Normal work of breathing on room air, CTAB Abdominal: Soft, nontender, nondistended Extremities: Trace lower extremity pitting edema  Assessment & Plan:   Severe hypertension BP at goal today, will continue coreg, norvasc, farxiga, spironolactone, lasix.  Chronic HFrEF (heart failure with reduced ejection fraction) ([place]) Met with cardiology since last appt, has ECHO scheduled for late December. He is on GDMT including coreg, farxiga, spironolactone 25 and is prescribed lasix 20 daily but is taking this every other day with extra doses when he notices more weight gain or leg swelling. His symptoms have been well controlled on this regimen. Would want to add an ACE/ARB/ARNI to finish GDMT.  Will start low-dose Entresto today given recent normal potassium and baseline creatinine on recent CMP.  Can uptitrate at upcoming cardiology visit next week.  Type 2 diabetes mellitus with kidney complication, without long-term current use of insulin (HCC) A1c 8.7 last visit 1 month ago. He is taking farxiga 10 and  metformin 1000 BID. He is working on lifestyle changes which were briefly put on back burner as he was working through his prostate cancer diagnosis.  Will check A1c in 2 months and if it is still elevated despite changes, would consider adding GLP-1  Malignant neoplasm of prostate ([place]) Since last appointment, has had PET scan which showed localized disease in left prostate with exception of possible uptake in left thyroid.  MRI with contrast was suggested to further assess.  Will touch base with his oncologist and follow-up on this.  He mentions he has planned radiation treatments in the future but has not started yet.  He will likely need time off work/disability to go through these treatments and manage his remaining health problems.  Abnormal thyroid uptake Had abnormal uptake adjacent to the left thyroid on recent PMSA PET scan.  Commended follow-up with MRI neck with contrast and will obtain TSH as well.  Likely incidental finding unrelated to prostate cancer, we will follow-up on subsequent results.    Patient seen with Dr.  Saverio Danker

## 2022-04-02 NOTE — Assessment & Plan Note (Addendum)
Met with cardiology since last appt, has ECHO scheduled for late December. He is on GDMT including coreg, farxiga, spironolactone 25 and is prescribed lasix 20 daily but is taking this every other day with extra doses when he notices more weight gain or leg swelling. His symptoms have been well controlled on this regimen. Would want to add an ACE/ARB/ARNI to finish GDMT.  Will start low-dose Entresto today given recent normal potassium and baseline creatinine on recent CMP.  Can uptitrate at upcoming cardiology visit next week.

## 2022-04-02 NOTE — Assessment & Plan Note (Addendum)
A1c 8.7 last visit 1 month ago. He is taking farxiga 10 and metformin 1000 BID. He is working on lifestyle changes which were briefly put on back burner as he was working through his prostate cancer diagnosis.  Will check A1c in 2 months and if it is still elevated despite changes, would consider adding GLP-1

## 2022-04-02 NOTE — Patient Instructions (Signed)
Jason Mccarty, it was a pleasure seeing you today! You endorsed feeling well today. Below are some of the things we talked about this visit. We look forward to seeing you in the follow up appointment!  Today we discussed: Heart failure/blood pressure: I am starting low dose entresto, your cardiologist might go up on the dose when you see them  Diabetes: If your A1c is high at the next visit, we will talk about starting ozempic  Doctors: [facility] [place]  Cardiology: Berniece Salines, [place],  [address] [place], [place] [phone]  Urologist: [facility] [address] [place] [address], [place], [place] [id number] [phone]  Radiation Oncology: [facility] [place], [place], [place] [address] [phone]  Oncologist: [facility] [place], [place], [place] [address] [phone]  I have ordered the following labs today:  Lab Orders  No laboratory test(s) ordered today      Referrals ordered today:   Referral Orders  No referral(s) requested today     I have ordered the following medication/changed the following medications:   Stop the following medications: There are no discontinued medications.   Start the following medications: Meds ordered this encounter  Medications   sacubitril-valsartan (ENTRESTO) 24-26 MG    Sig: Take 1 tablet by mouth 2 (two) times daily.    Dispense:  60 tablet    Refill:  0     Follow-up: 2 months   Please make sure to arrive 15 minutes prior to your next appointment. If you arrive late, you may be asked to reschedule.   We look forward to seeing you next time. Please call our clinic at [phone] if you have any questions or concerns. The best time to call is Monday-Friday from 9am-4pm, but there is someone available 24/7. If after hours or the weekend, call the main hospital  number and ask for the Internal Medicine Resident On-Call. If you need medication refills, please notify your pharmacy one week in advance and they will send [place] a request.  Thank you for letting us take part in your care. Wishing you the best!  Thank you, Linus Galas MD

## 2022-04-03 DIAGNOSIS — R946 Abnormal results of thyroid function studies: Secondary | ICD-10-CM | POA: Insufficient documentation

## 2022-04-03 NOTE — Assessment & Plan Note (Signed)
Had abnormal uptake adjacent to the left thyroid on recent PMSA PET scan.  Commended follow-up with MRI neck with contrast and will obtain TSH as well.  Likely incidental finding unrelated to prostate cancer, we will follow-up on subsequent results.

## 2022-04-03 NOTE — Progress Notes (Signed)
Internal Medicine Clinic Attending  I saw and evaluated the patient.  I personally confirmed the key portions of the history and exam documented by Dr. Jodell Cipro and I reviewed pertinent patient test results.  The assessment, diagnosis, and plan were formulated together and I agree with the documentation in the resident's note.

## 2022-04-03 NOTE — Addendum Note (Signed)
Addended byLinus Galas on: 04/03/2022 10:13 AM   Modules accepted: Orders

## 2022-04-07 ENCOUNTER — Encounter (HOSPITAL_BASED_OUTPATIENT_CLINIC_OR_DEPARTMENT_OTHER): Payer: Self-pay

## 2022-04-07 ENCOUNTER — Emergency Department (HOSPITAL_BASED_OUTPATIENT_CLINIC_OR_DEPARTMENT_OTHER)
Admission: EM | Admit: 2022-04-07 | Discharge: 2022-04-07 | Disposition: A | Discharge status: Home / Self Care | Payer: Commercial Managed Care - HMO | Attending: Emergency Medicine | Admitting: Emergency Medicine

## 2022-04-07 ENCOUNTER — Other Ambulatory Visit: Payer: Self-pay

## 2022-04-07 DIAGNOSIS — R059 Cough, unspecified: Secondary | ICD-10-CM | POA: Diagnosis present

## 2022-04-07 DIAGNOSIS — N189 Chronic kidney disease, unspecified: Secondary | ICD-10-CM | POA: Insufficient documentation

## 2022-04-07 DIAGNOSIS — I130 Hypertensive heart and chronic kidney disease with heart failure and stage 1 through stage 4 chronic kidney disease, or unspecified chronic kidney disease: Secondary | ICD-10-CM | POA: Insufficient documentation

## 2022-04-07 DIAGNOSIS — Z79899 Other long term (current) drug therapy: Secondary | ICD-10-CM | POA: Diagnosis not present

## 2022-04-07 DIAGNOSIS — Z7982 Long term (current) use of aspirin: Secondary | ICD-10-CM | POA: Insufficient documentation

## 2022-04-07 DIAGNOSIS — I509 Heart failure, unspecified: Secondary | ICD-10-CM | POA: Diagnosis not present

## 2022-04-07 DIAGNOSIS — Z1152 Encounter for screening for COVID-19: Secondary | ICD-10-CM | POA: Diagnosis not present

## 2022-04-07 DIAGNOSIS — J101 Influenza due to other identified influenza virus with other respiratory manifestations: Secondary | ICD-10-CM | POA: Diagnosis not present

## 2022-04-07 DIAGNOSIS — E1122 Type 2 diabetes mellitus with diabetic chronic kidney disease: Secondary | ICD-10-CM | POA: Insufficient documentation

## 2022-04-07 DIAGNOSIS — Z7984 Long term (current) use of oral hypoglycemic drugs: Secondary | ICD-10-CM | POA: Diagnosis not present

## 2022-04-07 LAB — RESP PANEL BY RT-PCR (RSV, FLU A&B, COVID)  RVPGX2
Influenza A by PCR: POSITIVE — AB
Influenza B by PCR: NEGATIVE
Resp Syncytial Virus by PCR: NEGATIVE
SARS Coronavirus 2 by RT PCR: NEGATIVE

## 2022-04-07 MED ORDER — OSELTAMIVIR PHOSPHATE 75 MG PO CAPS: 75.0000 mg | ORAL_CAPSULE | Freq: Two times a day (BID) | ORAL | 0 refills | Status: DC

## 2022-04-07 MED ORDER — BENZONATATE 100 MG PO CAPS: 100.0000 mg | ORAL_CAPSULE | Freq: Three times a day (TID) | ORAL | 0 refills | Status: DC

## 2022-04-07 NOTE — ED Provider Notes (Signed)
[facility] EMERGENCY DEPT Provider Note   CSN: [id number] Arrival date & time: 04/07/22  1637     History  Chief Complaint  Patient presents with   Cough    Jason Mccarty is a 61 y.o. male.  With a history of hypertension, diabetes, heart failure, CKD who presents ED for evaluation of cough and rhinorrhea.  Also had 1 episode of diarrhea today.  Symptoms began yesterday afternoon.  Currently has his daughter visiting who has been sick for the past 5 days with cough, congestion, rhinorrhea and diarrhea.  Denies productive cough, chest pain, worsening shortness of breath, sore throat.  Takes Zyrtec and Flonase daily.   Cough Associated symptoms: rhinorrhea        Home Medications Prior to Admission medications   Medication Sig Start Date End Date Taking? Authorizing Provider  benzonatate (TESSALON) 100 MG capsule Take 1 capsule (100 mg total) by mouth every 8 (eight) hours. 04/07/22  Yes Schutt, Alexander M, PA-C  oseltamivir (TAMIFLU) 75 MG capsule Take 1 capsule (75 mg total) by mouth every 12 (twelve) hours. 04/07/22  Yes Schutt, Alexander M, PA-C  Accu-Chek FastClix Lancets MISC USE AS DIRECTED 10/02/21   Jinwala, Sagar, MD  amLODipine (NORVASC) 10 MG tablet Take 1 tablet (10 mg total) by mouth daily. 02/27/22 11/24/22  Jinwala, Sagar, MD  aspirin EC 81 MG tablet Take 1 tablet (81 mg total) by mouth daily. Once a week 06/26/21   Jinwala, Sagar, MD  atorvastatin (LIPITOR) 40 MG tablet Take 1 tablet (40 mg total) by mouth daily. 02/27/22   Jinwala, Sagar, MD  blood glucose meter kit and supplies KIT Use up to one time daily as directed. 06/26/21   Jinwala, Sagar, MD  carvedilol (COREG) 6.25 MG tablet Take 1 tablet (6.25 mg total) by mouth 2 (two) times daily with a meal. 02/27/22   Jinwala, Sagar, MD  dapagliflozin propanediol (FARXIGA) 5 MG TABS tablet Take 1 tablet (5 mg total) by mouth daily before breakfast. 02/27/22   Jinwala, Sagar, MD  fluticasone (FLONASE) 50  MCG/ACT nasal spray Place 1 spray into both nostrils daily. 02/27/22   Jinwala, Sagar, MD  furosemide (LASIX) 20 MG tablet Take 1 tablet (20 mg total) by mouth daily. 02/27/22   Jinwala, Sagar, MD  glucose blood test strip Use up to 1 time daily. 06/26/21   Jinwala, Sagar, MD  metFORMIN (GLUCOPHAGE) 500 MG tablet Take 2 tablets (1,000 mg total) by mouth daily with breakfast. 03/01/22   Jinwala, Sagar, MD  Multiple Vitamins-Minerals (MENS MULTIVITAMIN PLUS PO) Take by mouth daily.    [provider]  sacubitril-valsartan (ENTRESTO) 24-26 MG Take 1 tablet by mouth 2 (two) times daily. 04/02/22   Sridharan, Sriramkumar, MD  sildenafil (VIAGRA) 50 MG tablet Take 1 tablet (50 mg total) by mouth as needed for erectile dysfunction. Take 1 hour before sexual activity. If you experience headache, reduce to half tablet. 06/18/21 06/18/22  Basaraba, Iulia, MD  solifenacin (VESICARE) 5 MG tablet Take 1 tablet (5 mg total) by mouth daily. 02/27/22   Jinwala, Sagar, MD  spironolactone (ALDACTONE) 25 MG tablet Take 1 tablet (25 mg total) by mouth daily. 02/27/22   Jinwala, Sagar, MD  tamsulosin (FLOMAX) 0.4 MG CAPS capsule Take 1 capsule (0.4 mg total) by mouth daily after breakfast. 02/27/22   Jinwala, Sagar, MD      Allergies    Benadryl [diphenhydramine]    Review of Systems   Review of Systems  HENT:  Positive for rhinorrhea.     Respiratory:  Positive for cough.   Gastrointestinal:  Positive for diarrhea.  All other systems reviewed and are negative.   Physical Exam Updated Vital Signs BP 119/67 (BP Location: Right Arm)   Pulse 72   Temp 99.8 F (37.7 C)   Resp 20   Ht 5' 11" (1.803 m)   Wt (!) 137.3 kg   SpO2 96%   BMI 42.23 kg/m  Physical Exam Vitals and nursing note reviewed.  Constitutional:      General: He is not in acute distress.    Appearance: He is well-developed. He is obese. He is not ill-appearing, toxic-appearing or diaphoretic.  HENT:     Head: Normocephalic and  atraumatic.     Nose: Rhinorrhea present. No congestion.     Mouth/Throat:     Mouth: Mucous membranes are moist.     Pharynx: Oropharynx is clear. No oropharyngeal exudate or posterior oropharyngeal erythema.     Comments: Postnasal drip Eyes:     Conjunctiva/sclera: Conjunctivae normal.  Cardiovascular:     Rate and Rhythm: Normal rate and regular rhythm.     Heart sounds: No murmur heard. Pulmonary:     Effort: Pulmonary effort is normal. No respiratory distress.     Breath sounds: Normal breath sounds. No stridor. No wheezing, rhonchi or rales.  Abdominal:     Palpations: Abdomen is soft.     Tenderness: There is no abdominal tenderness. There is no guarding.  Musculoskeletal:        General: No swelling.     Cervical back: Normal range of motion and neck supple.  Skin:    General: Skin is warm and dry.     Capillary Refill: Capillary refill takes less than 2 seconds.  Neurological:     Mental Status: He is alert.  Psychiatric:        Mood and Affect: Mood normal.     ED Results / Procedures / Treatments   Labs (all labs ordered are listed, but only abnormal results are displayed) Labs Reviewed  RESP PANEL BY RT-PCR (RSV, FLU A&B, COVID)  [id number] - Abnormal; Notable for the following components:      Result Value   Influenza A by PCR POSITIVE (*)    All other components within normal limits    EKG None  Radiology No results found.  Procedures Procedures    Medications Ordered in ED Medications - No data to display  ED Course/ Medical Decision Making/ A&P                           Medical Decision Making Risk Prescription drug management.  This patient presents to the ED for concern of URI symptoms, this involves an extensive number of treatment options, and is a complaint that carries with it a high risk of complications and morbidity.  The differential diagnosis includes flu, COVID, RSV, other URI   Co morbidities that complicate the patient  evaluation   diabetes, hypertension, CHF  My initial workup includes respiratory panel  Additional history obtained from: Nursing notes from this visit.  I ordered, reviewed and interpreted labs which include: Respiratory panel.  Influenza A positive.  Afebrile, hemodynamically stable.  61-year-old male presenting to the ED for evaluation of cough, rhinorrhea and diarrhea.  Physical exam remarkable for an obese individual who is in no acute distress.  He does have an intermittent dry cough.  There are no adventitious breath sounds.  He did test   positive for influenza a.  Symptom onset was approximately 12 hours ago.  He will be sent a prescription for Tamiflu and Tessalon Perles.  I did have a conversation with the patient regarding the cause of his cough and that it is most likely due to postnasal drip from his rhinorrhea.  He does not believe this is the cause of his symptoms and is requesting cough medicine.  I have low suspicion for superimposed bacterial pneumonia.  He appears overall very well.  He will be given return precautions and encouraged to follow-up with his primary care provider in 7 to 10 days.  Stable at discharge.  At this time there does not appear to be any evidence of an acute emergency medical condition and the patient appears stable for discharge with appropriate outpatient follow up. Diagnosis was discussed with patient who verbalizes understanding of care plan and is agreeable to discharge. I have discussed return precautions with patient and significant other who verbalizes understanding. Patient encouraged to follow-up with their PCP within 1 week. All questions answered.   Note: Portions of this report may have been transcribed using voice recognition software. Every effort was made to ensure accuracy; however, inadvertent computerized transcription errors may still be present.         Final Clinical Impression(s) / ED Diagnoses Final diagnoses:  Influenza A     Rx / DC Orders ED Discharge Orders          Ordered    oseltamivir (TAMIFLU) 75 MG capsule  Every 12 hours        04/07/22 2046    benzonatate (TESSALON) 100 MG capsule  Every 8 hours        04/07/22 2046              Nehemiah Massed 04/07/22 2053    Wyvonnia Dusky, MD 04/07/22 2325

## 2022-04-07 NOTE — ED Triage Notes (Signed)
Pt c/o URI symptoms onset yesterday, advises sick contact w granddaughter w similar symptoms, unsure of diagnosis

## 2022-04-07 NOTE — Discharge Instructions (Signed)
You have been seen today for your complaint of cough and influenza. Your lab work was positive for influenza A. Your discharge medications include Tamiflu.  This is an antiviral for influenza.  Take it as prescribed. Tessalon Perles.  This is a cough medicine.  You may take this as needed for persistent cough.  You may also try over-the-counter remedies such as Delsym, Mucinex, cough drops, warm tea with honey Home care instructions are as follows:  Drink plenty of fluids Follow up with: Your primary care provider in 1 week Please seek immediate medical care if you develop any of the following symptoms: Develop shortness of breath or have difficulty breathing. Have skin or nails that turn a bluish color. Have severe pain or stiffness in your neck. Develop a sudden headache or sudden pain in your face or ear. Cannot eat or drink without vomiting. At this time there does not appear to be the presence of an emergent medical condition, however there is always the potential for conditions to change. Please read and follow the below instructions.  Do not take your medicine if  develop an itchy rash, swelling in your mouth or lips, or difficulty breathing; call 911 and seek immediate emergency medical attention if this occurs.  You may review your lab tests and imaging results in their entirety on your MyChart account.  Please discuss all results of fully with your primary care provider and other specialist at your follow-up visit.  Note: Portions of this text may have been transcribed using voice recognition software. Every effort was made to ensure accuracy; however, inadvertent computerized transcription errors may still be present.

## 2022-04-07 NOTE — ED Notes (Signed)
Discharge paperwork given and verbally understood. 

## 2022-04-12 ENCOUNTER — Other Ambulatory Visit (HOSPITAL_COMMUNITY): Payer: Commercial Managed Care - PPO

## 2022-04-18 ENCOUNTER — Other Ambulatory Visit: Payer: Self-pay | Admitting: Urology

## 2022-04-18 DIAGNOSIS — D380 Neoplasm of uncertain behavior of larynx: Secondary | ICD-10-CM

## 2022-04-22 ENCOUNTER — Other Ambulatory Visit: Payer: Self-pay

## 2022-04-29 ENCOUNTER — Other Ambulatory Visit (HOSPITAL_COMMUNITY): Payer: Self-pay

## 2022-05-09 ENCOUNTER — Telehealth: Payer: Self-pay | Admitting: *Deleted

## 2022-05-09 ENCOUNTER — Telehealth: Payer: Self-pay | Admitting: Cardiology

## 2022-05-09 ENCOUNTER — Other Ambulatory Visit: Payer: Self-pay | Admitting: Urology

## 2022-05-09 NOTE — Telephone Encounter (Signed)
   Pre-operative Risk Assessment    Patient Name: Jason Mccarty  DOB: 16-Jun-1960 MRN: [id number]      Request for Surgical Clearance    Procedure:   Radioactive Seed Implant  Date of Surgery:  Clearance 06/25/22                                 Surgeon:  Dr. Junious Silk Surgeon's Group or Practice Name:  [facility] Phone number:  [phone] Fax number:  [phone]   Type of Clearance Requested:   - Pharmacy:  Hold Aspirin     Type of Anesthesia:  General    Additional requests/questions:  Please advise surgeon/provider what medications should be held.  Signed, Belisicia T Harris   05/09/2022, 4:09 PM

## 2022-05-09 NOTE — Telephone Encounter (Signed)
CALLED PATIENT TO ASK QUESTIONS, SPOKE WITH PATIENT

## 2022-05-10 ENCOUNTER — Ambulatory Visit: Payer: Self-pay | Admitting: Urology

## 2022-05-10 NOTE — Telephone Encounter (Signed)
   Patient Name: Jason Mccarty  DOB: 1961/03/21 MRN: [id number]  Primary Cardiologist: Berniece Salines, DO  Chart reviewed as part of pre-operative protocol coverage. Pre-op clearance already addressed by colleagues in earlier phone notes. To summarize recommendations:  -Please hold ASA x 7 and restart when medically safe to do so.   Will route this bundled recommendation to requesting provider via Epic fax function and remove from pre-op pool. Please call with questions.  Elgie Collard, PA-C 05/10/2022, 10:19 AM

## 2022-05-15 ENCOUNTER — Ambulatory Visit (HOSPITAL_COMMUNITY): Payer: Medicaid Other | Attending: Cardiology

## 2022-05-15 DIAGNOSIS — I5020 Unspecified systolic (congestive) heart failure: Secondary | ICD-10-CM | POA: Diagnosis not present

## 2022-05-15 LAB — ECHOCARDIOGRAM COMPLETE
Area-P 1/2: 2.95 cm2
S' Lateral: 2.7 cm

## 2022-05-16 ENCOUNTER — Ambulatory Visit (HOSPITAL_COMMUNITY): Admission: RE | Admit: 2022-05-16 | Payer: BC Managed Care – PPO | Source: Ambulatory Visit

## 2022-05-16 ENCOUNTER — Encounter (HOSPITAL_COMMUNITY): Payer: Self-pay

## 2022-05-16 ENCOUNTER — Telehealth: Payer: BC Managed Care – PPO

## 2022-05-16 NOTE — Telephone Encounter (Signed)
Patient called he stated his appointment for MRI got canceled due to his insurance-[employer] being out of network. Patient is requesting a referral to be sent to a facility that is in network with his insurance.

## 2022-05-21 ENCOUNTER — Telehealth: Payer: Self-pay | Admitting: *Deleted

## 2022-05-21 NOTE — Telephone Encounter (Signed)
CALLED PATIENT TO REMIND OF PRE-SEED APPTS. FOR 05-23-22, SPOKE WITH PATIENT AND HE IS AWARE OF THESE APPTS.

## 2022-05-22 ENCOUNTER — Encounter: Payer: Self-pay | Admitting: Student

## 2022-05-22 ENCOUNTER — Other Ambulatory Visit: Payer: Self-pay

## 2022-05-22 ENCOUNTER — Ambulatory Visit (INDEPENDENT_AMBULATORY_CARE_PROVIDER_SITE_OTHER): Payer: BLUE CROSS/BLUE SHIELD | Admitting: Student

## 2022-05-22 VITALS — BP 131/62 | HR 63 | Temp 98.3°F | Ht 71.0 in | Wt 301.8 lb

## 2022-05-22 DIAGNOSIS — E1122 Type 2 diabetes mellitus with diabetic chronic kidney disease: Secondary | ICD-10-CM | POA: Diagnosis not present

## 2022-05-22 DIAGNOSIS — N1831 Chronic kidney disease, stage 3a: Secondary | ICD-10-CM

## 2022-05-22 DIAGNOSIS — I5022 Chronic systolic (congestive) heart failure: Secondary | ICD-10-CM | POA: Diagnosis not present

## 2022-05-22 DIAGNOSIS — F17210 Nicotine dependence, cigarettes, uncomplicated: Secondary | ICD-10-CM

## 2022-05-22 DIAGNOSIS — C61 Malignant neoplasm of prostate: Secondary | ICD-10-CM

## 2022-05-22 DIAGNOSIS — Z7984 Long term (current) use of oral hypoglycemic drugs: Secondary | ICD-10-CM

## 2022-05-22 DIAGNOSIS — I130 Hypertensive heart and chronic kidney disease with heart failure and stage 1 through stage 4 chronic kidney disease, or unspecified chronic kidney disease: Secondary | ICD-10-CM | POA: Diagnosis not present

## 2022-05-22 DIAGNOSIS — I10 Essential (primary) hypertension: Secondary | ICD-10-CM

## 2022-05-22 LAB — GLUCOSE, CAPILLARY: Glucose-Capillary: 233 mg/dL — ABNORMAL HIGH (ref 70–99)

## 2022-05-22 LAB — POCT GLYCOSYLATED HEMOGLOBIN (HGB A1C): Hemoglobin A1C: 8.1 % — AB (ref 4.0–5.6)

## 2022-05-22 NOTE — Assessment & Plan Note (Signed)
Seeing radiation oncology tomorrow. Plan for radioactive seeding therapy in March 2024. Weight is stable and he is feeling fine currently.

## 2022-05-22 NOTE — Assessment & Plan Note (Addendum)
Patient living with HTN, currently on coreg 6.'25mg'$  BID, farxiga '5mg'$  daily, spironolactone '25mg'$  daily, entresto 24-'26mg'$  BID, norvasc '10mg'$  daily, and lasix '20mg'$  daily. BP 131/62 in clinic today. He is out of his farxiga as he was unable to afford the copay and is currently unemployed. He will obtain this as soon as he is able to.  -continue current medications

## 2022-05-22 NOTE — Progress Notes (Signed)
   CC: f/u T2DM, HTN, chronic HFrEF  HPI:  JasonJason Mccarty is a 62 y.o. male with history listed below presenting to the [facility] for f/u T2DM, HTN, chronic HFrEF. Please see individualized problem based charting for full HPI.  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  Physical Exam:  Vitals:   05/22/22 1513  BP: 131/62  Pulse: 63  Temp: 98.3 F (36.8 C)  TempSrc: Oral  SpO2: 100%  Weight: (!) 301 lb 12.8 oz (136.9 kg)  Height: '5\' 11"'$  (1.803 m)   Physical Exam Constitutional:      Appearance: Normal appearance. He is obese. He is not ill-appearing.  HENT:     Mouth/Throat:     Mouth: Mucous membranes are moist.     Pharynx: Oropharynx is clear.  Eyes:     Extraocular Movements: Extraocular movements intact.     Conjunctiva/sclera: Conjunctivae normal.  Cardiovascular:     Rate and Rhythm: Normal rate and regular rhythm.     Heart sounds: Normal heart sounds. No murmur heard.    No gallop.  Pulmonary:     Effort: Pulmonary effort is normal.     Breath sounds: Normal breath sounds. No wheezing, rhonchi or rales.  Abdominal:     General: Bowel sounds are normal. There is no distension.     Palpations: Abdomen is soft.     Tenderness: There is no abdominal tenderness.  Musculoskeletal:        General: Normal range of motion.     Comments: 0-1+ pitting edema in bilateral lower extremities up to mid-shins.  Skin:    Comments: Dry skin in bilateral lower extremities, particularly around ankles.   Neurological:     Mental Status: He is alert and oriented to person, place, and time. Mental status is at baseline.   Psychiatric:        Mood and Affect: Mood normal.        Behavior: Behavior normal.      Assessment & Plan:   See Encounters Tab for problem based charting.  Patient discussed with Dr.  Cain Sieve

## 2022-05-22 NOTE — Progress Notes (Signed)
  Radiation Oncology         [phone] ________________________________  Name: Cesare Sumlin MRN: [id number]  Date: 05/23/2022  DOB: 1960-10-12  SIMULATION AND TREATMENT PLANNING NOTE PUBIC ARCH STUDY  WN:[id number], Soyla Murphy, MD  [name], MD  DIAGNOSIS: 62 y.o. gentleman with stage T1c adenocarcinoma of the prostate with a Gleason's score of 4+4 and a PSA of 10   Oncology History  Malignant neoplasm of prostate ([place])  02/22/2022 Initial Diagnosis   Malignant neoplasm of prostate ([place])   02/22/2022 Cancer Staging   Staging form: Prostate, AJCC 8th Edition - Clinical: cT1c, cN0, PSA: 13, Grade Group: 4 - Signed by Wyatt Portela, MD on 02/22/2022 Prostate specific antigen (PSA) range: 10 to 19 Gleason score: 8 Histologic grading system: 5 grade system       ICD-10-CM   1. Malignant neoplasm of prostate ([place])  C61       COMPLEX SIMULATION:  The patient presented today for evaluation for possible prostate seed implant. He was brought to the radiation planning suite and placed supine on the CT couch. A 3-dimensional image study set was obtained in upload to the planning computer. There, on each axial slice, I contoured the prostate gland. Then, using three-dimensional radiation planning tools I reconstructed the prostate in view of the structures from the transperineal needle pathway to assess for possible pubic arch interference. In doing so, I did not appreciate any pubic arch interference. Also, the patient's prostate volume was estimated based on the drawn structure. The volume was 25 cc.  Given the pubic arch appearance and prostate volume, patient remains a good candidate to proceed with prostate seed implant. Today, he freely provided informed written consent to proceed.    PLAN: The patient will undergo prostate seed implant.   ________________________________  Sheral Apley. Tammi Klippel, M.D.

## 2022-05-22 NOTE — Assessment & Plan Note (Signed)
Patient living with T2DM c/b CKD3A, currently taking metformin and farxiga. He is out of his farxiga due to cost, but will obtain it as soon as he can. A1c slightly improved from 8.7% to 8.1% today. We discussed up-titrating his metformin from '500mg'$  BID. He will start taking metformin '1000mg'$  in the morning and '500mg'$  at night. If tolerating well, will increase to metformin '1000mg'$  BID in 1-2 weeks. Hopeful that his A1c will further improve with escalation of metformin dose and after restarting farxiga.  Plan: -metformin '1000mg'$  in the morning and '500mg'$  at night -increase to '1000mg'$  BID in 1-2 weeks if tolerating well -restart farxiga as able -f/u in 3 months for repeat A1c

## 2022-05-22 NOTE — Assessment & Plan Note (Addendum)
Patient living with chronic HFrEF. EF was previously 40-45% with recent ECHO showing improvement to 50-55% on GDMT. Current GDMT includes carvedilol, farxiga, spiro, entresto. He is taking lasix '20mg'$  daily as well. Following cardiology. He has mild pitting edema in bilateral lower extremities today but has not yet taken his lasix today. He is out of his farxiga at this time due to cost, but will obtain it as soon as he can.  Plan: -continue current regimen

## 2022-05-22 NOTE — Patient Instructions (Signed)
Mr. Espina,  It was a pleasure seeing you in the clinic today.   As we discussed, please start taking metformin 2 tablets in the morning and 1 tablet at night. After 1-2 weeks, if you are tolerating this dose fine then please increase to 2 tablets in the morning and 2 tablets at night. Please get your farxiga once you are able to so that we can get that extra blood sugar lowering effect. Please come back in 3 months for your next visit. I am wish you the best with the radiation therapy, I will be praying that it all goes well.  Please call our clinic at [phone] if you have any questions or concerns. The best time to call is Monday-Friday from 9am-4pm, but there is someone available 24/7 at the same number. If you need medication refills, please notify your pharmacy one week in advance and they will send [place] a request.   Thank you for letting us take part in your care. We look forward to seeing you next time!

## 2022-05-23 ENCOUNTER — Encounter: Payer: Self-pay | Admitting: Urology

## 2022-05-23 ENCOUNTER — Ambulatory Visit
Admission: RE | Admit: 2022-05-23 | Discharge: 2022-05-23 | Disposition: A | Discharge status: Home / Self Care | Payer: BLUE CROSS/BLUE SHIELD | Source: Ambulatory Visit | Attending: Urology | Admitting: Urology

## 2022-05-23 ENCOUNTER — Encounter (HOSPITAL_COMMUNITY)
Admission: RE | Admit: 2022-05-23 | Discharge: 2022-05-23 | Disposition: A | Discharge status: Home / Self Care | Payer: BLUE CROSS/BLUE SHIELD | Source: Ambulatory Visit | Attending: Urology | Admitting: Urology

## 2022-05-23 ENCOUNTER — Ambulatory Visit
Admission: RE | Admit: 2022-05-23 | Discharge: 2022-05-23 | Disposition: A | Discharge status: Home / Self Care | Payer: BLUE CROSS/BLUE SHIELD | Source: Ambulatory Visit | Attending: Radiation Oncology | Admitting: Radiation Oncology

## 2022-05-23 VITALS — Ht 71.0 in | Wt 302.0 lb

## 2022-05-23 DIAGNOSIS — Z01810 Encounter for preprocedural cardiovascular examination: Secondary | ICD-10-CM | POA: Insufficient documentation

## 2022-05-23 DIAGNOSIS — C61 Malignant neoplasm of prostate: Secondary | ICD-10-CM | POA: Diagnosis present

## 2022-05-23 DIAGNOSIS — R001 Bradycardia, unspecified: Secondary | ICD-10-CM | POA: Insufficient documentation

## 2022-05-23 DIAGNOSIS — Z01818 Encounter for other preprocedural examination: Secondary | ICD-10-CM

## 2022-05-23 NOTE — Progress Notes (Signed)
Radiation Oncology         [phone] ________________________________  Outpatient Follow up- Pre-seed visit  Name: Mariano Doshi MRN: [id number]  Date: 05/23/2022  DOB: 1960/04/21  FX:[id number], Soyla Murphy, MD  [name], MD   REFERRING PHYSICIAN: [name], MD  DIAGNOSIS: 62 y.o. 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 Malignant neoplasm of prostate ([place])  C61       HISTORY OF PRESENT ILLNESS: Kenston Longton is a 62 y.o. male with a diagnosis of prostate cancer. He was noted to have an elevated PSA of 13 by his primary care physician, Dr. Jodi Mourning. He underwent prostate MRI on 12/24/21 showing: 18 mm left peripheral zone lesion (PI-RADS 5); 14 mm right peripheral zone lesion (PI-RADS 4); prostate volume 23 g. Accordingly, he was referred for evaluation in urology by Dr. Junious Silk on 01/02/22. Repeat PSA obtained that day showed decreased but persistently elevated PSA of 10. The patient proceeded to transrectal ultrasound with 12 biopsies of the prostate on 01/30/22, digital rectal examination performed at that time showed no nodules.  The prostate volume measured 24.31 cc.  Out of 12 core biopsies, all 12 were positive.  The maximum Gleason score was 4+4, and this was seen in left base. Additionally, Gleason 4+3 was seen in left apex lateral (with perineural invasion), right base, and right base lateral. The remaining cores showed Gleason 3+4.   He had a PSMA PET scan 03/14/22 for disease staging and this was without evidence of definitive metastatic disease. There was a 6 mm LEFT external iliac lymph node with mild radiotracer activity (SUV max equal 2.4) and wispy tissue along the RIGHT common iliac artery measuring 6 mm, with mild radiotracer activity (SUV max equal 4.1) favored benign. There was intense activity localizing to the LEFT thyroid cartilage at the level of the larynx, of unknown significance.  Recommendation was for MRI neck with contrast to exclude an unusual site of skeletal/cartilage metastasis. An MRI neck was ordered on 04/18/22 by Dr.Eskridge but insurance authorization remains pending.  The patient reviewed the biopsy results with his urologist and was kindly referred to [place] for discussion of potential radiation treatment options. We initially met the patient in the prostate multidisciplinary clinic on 02/22/22 and he was most interested in proceeding with LT-ADT concurrent with prostate seed boost with SpaceOAR followed by a 5 week course of prostate IMRT to the pelvis for treatment of his disease, pending there was no significant metastatic disease on PSMA PET which was pending at that time. Fortunately, as noted above, there was no definite evidence of metastatic disease outside of the pelvis so he is here today for his pre-procedure imaging for planning and to answer any additional questions he may have about this treatment.  He received his first 31-monthEligard injection on 03/25/2022 and is tolerating this fairly well.   PREVIOUS RADIATION THERAPY: No  PAST MEDICAL HISTORY:  Past Medical History:  Diagnosis Date   Acute respiratory failure with hypercapnia ([facility] 05/11/2021   Heart failure with reduced ejection fraction ([facility]    Hepatocellular injury 05/17/2021   Hypertension    Obesity, Class III, BMI 40-49.9 (morbid obesity) ([facility]    Sleep apnea    Type 2 diabetes mellitus ([facility]       PAST SURGICAL HISTORY: Past Surgical History:  Procedure Laterality Date   HERNIA REPAIR      FAMILY HISTORY:  Family History  Problem Relation Age of Onset   Heart failure Father    Hypertension Father     SOCIAL HISTORY:  Social History   Socioeconomic History   Marital status: Single    Spouse name: Not on file   Number of children: Not on file   Years of education: Not on file   Highest education level: Not on file  Occupational History   Not on file  Tobacco Use    Smoking status: Some Days    Packs/day: 0.10    Types: Cigarettes   Smokeless tobacco: Never   Tobacco comments:    2 - 3 per day   Substance and Sexual Activity   Alcohol use: Not Currently    Comment: very rarely   Drug use: Never   Sexual activity: Not on file  Other Topics Concern   Not on file  Social History Narrative   Not on file   Social Determinants of Health   Financial Resource Strain: Low Risk  (04/02/2022)   Overall Financial Resource Strain (CARDIA)    Difficulty of Paying Living Expenses: Not hard at all  Food Insecurity: No Food Insecurity (04/02/2022)   Hunger Vital Sign    Worried About Running Out of Food in the Last Year: Never true    [place] in the Last Year: Never true  Transportation Needs: No Transportation Needs (04/02/2022)   PRAPARE - Hydrologist (Medical): No    Lack of Transportation (Non-Medical): No  Physical Activity: Inactive (04/02/2022)   Exercise Vital Sign    Days of Exercise per Week: 0 days    Minutes of Exercise per Session: 0 min  Stress: No Stress Concern Present (04/02/2022)   [place]    Feeling of Stress : Not at all  Social Connections: Moderately Isolated (05/22/2022)   Social Connection and Isolation Panel [NHANES]    Frequency of Communication with Friends and Family: More than three times a week    Frequency of Social Gatherings with Friends and Family: More than three times a week    Attends Religious Services: More than 4 times per year    Active Member of [employer] or Organizations: No    Attends Archivist Meetings: Never    Marital Status: Widowed  Intimate Partner Violence: Not At Risk (04/02/2022)   Humiliation, Afraid, Rape, and Kick questionnaire    Fear of Current or Ex-Partner: No    Emotionally Abused: No    Physically Abused: No    Sexually Abused: No    ALLERGIES: Benadryl  [diphenhydramine]  MEDICATIONS:  Current Outpatient Medications  Medication Sig Dispense Refill   Accu-Chek FastClix Lancets MISC USE AS DIRECTED 102 each 1   amLODipine (NORVASC) 10 MG tablet Take 1 tablet (10 mg total) by mouth daily. 90 tablet 2   aspirin EC 81 MG tablet Take 1 tablet (81 mg total) by mouth daily. Once a week 30 tablet 3   atorvastatin (LIPITOR) 40 MG tablet Take 1 tablet (40 mg total) by mouth daily. 90 tablet 3   blood glucose meter kit and supplies KIT Use up to one time daily as directed. 1 each 0   carvedilol (COREG) 6.25 MG tablet Take 1 tablet (6.25 mg total) by mouth 2 (two) times daily with a meal. 180 tablet 1   dapagliflozin propanediol (FARXIGA) 5 MG TABS tablet Take 1 tablet (5 mg total) by mouth  daily before breakfast. 30 tablet 2   fluticasone (FLONASE) 50 MCG/ACT nasal spray Place 1 spray into both nostrils daily. 16 g 0   furosemide (LASIX) 20 MG tablet Take 1 tablet (20 mg total) by mouth daily. 90 tablet 1   glucose blood test strip Use up to 1 time daily. 30 each 11   metFORMIN (GLUCOPHAGE) 500 MG tablet Take 2 tablets (1,000 mg total) by mouth daily with breakfast. 180 tablet 1   Multiple Vitamins-Minerals (MENS MULTIVITAMIN PLUS PO) Take by mouth daily.     sacubitril-valsartan (ENTRESTO) 24-26 MG Take 1 tablet by mouth 2 (two) times daily. 60 tablet 0   sildenafil (VIAGRA) 50 MG tablet Take 1 tablet (50 mg total) by mouth as needed for erectile dysfunction. Take 1 hour before sexual activity. If you experience headache, reduce to half tablet. 10 tablet 0   solifenacin (VESICARE) 5 MG tablet Take 1 tablet (5 mg total) by mouth daily. 60 tablet 1   spironolactone (ALDACTONE) 25 MG tablet Take 1 tablet (25 mg total) by mouth daily. 90 tablet 3   tamsulosin (FLOMAX) 0.4 MG CAPS capsule Take 1 capsule (0.4 mg total) by mouth daily after breakfast. 90 capsule 1   No current facility-administered medications for this visit.    REVIEW OF SYSTEMS:   On  review of systems, the patient reports that he is doing well overall. He denies any chest pain, shortness of breath, cough, fevers, chills, night sweats, unintended weight changes. He denies any bowel disturbances, and denies abdominal pain, nausea or vomiting. He denies any new musculoskeletal or joint aches or pains. His IPSS was 14 indicating moderate urinary symptoms (Reference 0-7 mild, 8-19 moderate, 20-35 severe).  His IIEF was 5, indicating he severe erectile dysfunction (Reference - 22-25 None, 17-21 Mild, 8-16 Moderate, 1-7 Severe). A complete review of systems is obtained and is otherwise negative.     PHYSICAL EXAM:  Wt Readings from Last 3 Encounters:  05/22/22 (!) 301 lb 12.8 oz (136.9 kg)  04/07/22 (!) 302 lb 12.8 oz (137.3 kg)  04/02/22 (!) 302 lb 12.8 oz (137.3 kg)   Temp Readings from Last 3 Encounters:  05/22/22 98.3 F (36.8 C) (Oral)  04/07/22 99.6 F (37.6 C) (Oral)  04/02/22 97.8 F (36.6 C) (Oral)   BP Readings from Last 3 Encounters:  05/22/22 131/62  04/07/22 132/69  04/02/22 131/68   Pulse Readings from Last 3 Encounters:  05/22/22 63  04/07/22 76  04/02/22 69    /10  In general this is a well appearing African American man in no acute distress. He's alert and oriented x4 and appropriate throughout the examination. Cardiopulmonary assessment is negative for acute distress, and he exhibits normal effort.     KPS = 100  100 - Normal; no complaints; no evidence of disease. 90   - Able to carry on normal activity; minor signs or symptoms of disease. 80   - Normal activity with effort; some signs or symptoms of disease. 1   - Cares for self; unable to carry on normal activity or to do active work. 60   - Requires occasional assistance, but is able to care for most of his personal needs. 50   - Requires considerable assistance and frequent medical care. 49   - Disabled; requires special care and assistance. 36   - Severely disabled; hospital admission is  indicated although death not imminent. 68   - Very sick; hospital admission necessary; active supportive treatment necessary. 10   -  Moribund; fatal processes progressing rapidly. 0     - Dead  Karnofsky DA, Abelmann WH, Craver LS and Burchenal [facility] [phone]) The use of the nitrogen mustards in the palliative treatment of carcinoma: with particular reference to bronchogenic carcinoma Cancer 1 634-56  LABORATORY DATA:  Lab Results  Component Value Date   WBC 9.0 05/13/2021   HGB 13.7 05/13/2021   HCT 41.9 05/13/2021   MCV 89.7 05/13/2021   PLT 220 05/13/2021   Lab Results  Component Value Date   NA 137 03/19/2022   K 3.6 03/19/2022   CL 101 03/19/2022   CO2 22 03/19/2022   Lab Results  Component Value Date   ALT 19 03/19/2022   AST 15 03/19/2022   ALKPHOS 84 03/19/2022   BILITOT 0.2 03/19/2022     RADIOGRAPHY: ECHOCARDIOGRAM COMPLETE  Result Date: 05/15/2022    ECHOCARDIOGRAM REPORT   Patient Name:   LARRI YEHLE  Date of Exam: 05/15/2022 Medical Rec #:  [id number]     Height:       71.0 in Accession #:    [id number]    Weight:       302.8 lb Date of Birth:  [birth year]      BSA:          2.515 m Patient Age:    14 years      BP:           142/70 mmHg Patient Gender: M             HR:           61 bpm. Exam Location:  [facility] [facility] Procedure: 2D Echo, Cardiac Doppler and Color Doppler Indications:    I50.20* Unspecified systolic (congestive) heart failure  History:        Patient has prior history of Echocardiogram examinations, most                 recent 05/11/2021. Previous Myocardial Infarction; Risk                 Factors:Diabetes, Sleep Apnea and Current Smoker. Hypertensive                 heart disease. Morbid obesity.  Sonographer:    Diamond Nickel RCS Referring Phys: [id number] [place]  1. Left ventricular ejection fraction, by estimation, is 50 to 55%. The left ventricle has low normal function. The left ventricle demonstrates regional wall motion abnormalities (see  scoring diagram/findings for description). There is mild left ventricular hypertrophy. Left ventricular diastolic parameters are consistent with Grade I diastolic dysfunction (impaired relaxation).  2. Right ventricular systolic function is normal. The right ventricular size is normal.  3. The mitral valve is normal in structure. No evidence of mitral valve regurgitation. No evidence of mitral stenosis.  4. The aortic valve is normal in structure. Aortic valve regurgitation is not visualized. No aortic stenosis is present.  5. The inferior vena cava is normal in size with greater than 50% respiratory variability, suggesting right atrial pressure of 3 mmHg. Comparison(s): Prior images reviewed side by side. The left ventricular function has improved. FINDINGS  Left Ventricle: Left ventricular ejection fraction, by estimation, is 50 to 55%. The left ventricle has low normal function. The left ventricle demonstrates regional wall motion abnormalities. The left ventricular internal cavity size was normal in size. There is mild left ventricular hypertrophy. Left ventricular diastolic parameters are consistent with Grade I diastolic dysfunction (impaired relaxation).  LV Wall Scoring: The inferior  wall is akinetic. Right Ventricle: The right ventricular size is normal. No increase in right ventricular wall thickness. Right ventricular systolic function is normal. Left Atrium: Left atrial size was normal in size. Right Atrium: Right atrial size was normal in size. Pericardium: There is no evidence of pericardial effusion. Mitral Valve: The mitral valve is normal in structure. No evidence of mitral valve regurgitation. No evidence of mitral valve stenosis. Tricuspid Valve: The tricuspid valve is normal in structure. Tricuspid valve regurgitation is not demonstrated. No evidence of tricuspid stenosis. Aortic Valve: The aortic valve is normal in structure. Aortic valve regurgitation is not visualized. No aortic stenosis is  present. Pulmonic Valve: The pulmonic valve was normal in structure. Pulmonic valve regurgitation is not visualized. No evidence of pulmonic stenosis. Aorta: The aortic root is normal in size and structure. Venous: The inferior vena cava is normal in size with greater than 50% respiratory variability, suggesting right atrial pressure of 3 mmHg. IAS/Shunts: No atrial level shunt detected by color flow Doppler.  LEFT VENTRICLE PLAX 2D LVIDd:         4.20 cm   Diastology LVIDs:         2.70 cm   LV e' medial:    8.16 cm/s LV PW:         1.40 cm   LV E/e' medial:  9.6 LV IVS:        1.20 cm   LV e' lateral:   5.43 cm/s LVOT diam:     2.00 cm   LV E/e' lateral: 14.5 LV SV:         75 LV SV Index:   30 LVOT Area:     3.14 cm  RIGHT VENTRICLE RV Basal diam:  3.00 cm RV S prime:     14.10 cm/s TAPSE (M-mode): 2.6 cm LEFT ATRIUM             Index        RIGHT ATRIUM           Index LA diam:        2.80 cm 1.11 cm/m   RA Area:     15.80 cm LA Vol (A2C):   61.6 ml 24.49 ml/m  RA Volume:   40.40 ml  16.06 ml/m LA Vol (A4C):   38.9 ml 15.47 ml/m LA Biplane Vol: 53.1 ml 21.11 ml/m  AORTIC VALVE LVOT Vmax:   115.00 cm/s LVOT Vmean:  73.400 cm/s LVOT VTI:    0.239 m  AORTA Ao Root diam: 2.90 cm Ao Asc diam:  3.30 cm MITRAL VALVE MV Area (PHT): 2.95 cm    SHUNTS MV Decel Time: 257 msec    Systemic VTI:  0.24 m MV E velocity: 78.60 cm/s  Systemic Diam: 2.00 cm MV A velocity: 95.40 cm/s MV E/A ratio:  0.82 Candee Furbish MD Electronically signed by Candee Furbish MD Signature Date/Time: 05/15/2022/3:16:00 PM    Final       IMPRESSION/PLAN: 1. 62 y.o. gentleman with Stage T1c adenocarcinoma of the prostate with a Gleason's score of 4+4 and a PSA of 10 . The patient has elected to proceed with LT-ADT concurrent with prostate seed boost with SpaceOAR followed by a 5 week course of prostate IMRT to the pelvis for treatment of his disease.  He received his initial 53-monthEligard injection on 03/25/2022.  Today, we reviewed the risks,  benefits, short and long-term effects associated with brachytherapy and discussed the role of SpaceOAR in reducing the rectal toxicity  associated with radiotherapy.  He appears to have a good understanding of his disease and our treatment recommendations which are of curative intent.  He was encouraged to ask questions that were answered to his stated satisfaction. He has freely signed written consent to proceed today in the office and a copy of this document will be placed in his medical record. His procedure is tentatively scheduled for 06/25/22 in collaboration with Dr. Junious Silk and we will see him back for his post-procedure visit approximately 3 weeks thereafter and will proceed with treatment planning and SIM for the daily external beam radiation at that time. We look forward to continuing to participate in his care. He knows that he is welcome to call with any questions or concerns at any time in the interim.  I personally spent 30 minutes in this encounter including chart review, reviewing radiological studies, meeting face-to-face with the patient, entering orders and completing documentation.    Nicholos Johns, MMS, PA-C [facility] [place]: [phone]  Fax: [phone]

## 2022-05-23 NOTE — Progress Notes (Signed)
Pre-seed nursing interview for 62 y.o. 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  I verified patient's identity and began nursing interview. Patient reports doing well.  Meaningful use complete. Tamsulosin as directed. Urology appt- 06/2022 w/ Dr. Junious Silk at [facility] [place]  Ht '5\' 11"'$  (1.803 m)   Wt (!) 302 lb (137 kg)   BMI 42.12 kg/m   This concludes the interview.   Leandra Kern, LPN

## 2022-05-23 NOTE — Progress Notes (Signed)
Internal Medicine Clinic Attending  Case discussed with Dr. Jinwala  At the time of the visit.  We reviewed the resident's history and exam and pertinent patient test results.  I agree with the assessment, diagnosis, and plan of care documented in the resident's note.  

## 2022-05-24 NOTE — Telephone Encounter (Signed)
Rec'd a message from [place], [place] in regards to the patient's MRI  that has been cancelled due to ins denial  Rec'd the following message from the patient's  [employer] Ins.  A number is listed below to call  for a Peer to Peer with [employer] if possible.   Order Request Summary Health Plan: [employer]  Scheduled Date of Service: 05/17/2022   Order ID: [id number]       Non-Authorized  NPI: [id number] Servicing Provider:  [place]  [place] , [place] [address] Phone: [phone] Fax: [phone] NPI: [id number] TIN: [id number]     The information below was obtained from the Ordering Provider and has not been independently verified by [facility] [employer]. [facility] assumes no responsibility for the accuracy of this information or for its consistency with the patient's medical record. Please call [phone] for all Urgent Requests.   REQUESTED EXAMS  EXAM REQUEST STATUS REASON ACTION  Orbit, Face, Soft Tissue Neck - MRI   With Contrast   Non-Authorized  Criteria Not Met  Review Exam Withdraw Exam Clinical Rationale:  Your doctor told us that you have had abnormalities on another picture of your thyroid gland. Your doctor ordered an MRI of your eye, face and neck. An MRI is a way to take pictures of the inside of your body. This test should be used to help your doctor determine the best treatment plan for you. We reviewed the notes we have. The notes do not show that this is the reason you need this test. Based on the information we have, this test is not medically necessary for you at this time. We used [employer] Medical Benefits Management Clinical Guideline titled Imaging of the Head and Neck to make this decision.

## 2022-06-04 NOTE — Progress Notes (Signed)
RN spoke with patient to review if he has been notified by a different facility to schedule neck MRI that was ordered by Dr. Junious Silk.  MRI was scheduled but had to be cancelled due to non-authorization.  Pt stated that he has been approved recently for Medicaid and is waiting for his card.  Pt would like to proceed with neck MRI once he has Medicaid.  Will update MD's to ensure neck MRI gets reordered after medicaid.

## 2022-06-06 ENCOUNTER — Other Ambulatory Visit: Payer: Self-pay | Admitting: *Deleted

## 2022-06-06 DIAGNOSIS — E1122 Type 2 diabetes mellitus with diabetic chronic kidney disease: Secondary | ICD-10-CM

## 2022-06-06 DIAGNOSIS — I10 Essential (primary) hypertension: Secondary | ICD-10-CM

## 2022-06-06 DIAGNOSIS — I5022 Chronic systolic (congestive) heart failure: Secondary | ICD-10-CM

## 2022-06-06 MED ORDER — SPIRONOLACTONE 25 MG PO TABS: 25.0000 mg | ORAL_TABLET | Freq: Every day | ORAL | 3 refills | Status: DC

## 2022-06-06 MED ORDER — FLUTICASONE PROPIONATE 50 MCG/ACT NA SUSP: 1.0000 | Freq: Every day | NASAL | 0 refills | Status: DC

## 2022-06-06 MED ORDER — SILDENAFIL CITRATE 50 MG PO TABS: 50.0000 mg | ORAL_TABLET | ORAL | 0 refills | Status: DC | PRN

## 2022-06-06 MED ORDER — DAPAGLIFLOZIN PROPANEDIOL 5 MG PO TABS: 5.0000 mg | ORAL_TABLET | Freq: Every day | ORAL | 2 refills | Status: DC

## 2022-06-06 MED ORDER — ENTRESTO 24-26 MG PO TABS: 1.0000 | ORAL_TABLET | Freq: Two times a day (BID) | ORAL | 0 refills | Status: DC

## 2022-06-13 ENCOUNTER — Other Ambulatory Visit: Payer: Self-pay | Admitting: *Deleted

## 2022-06-13 DIAGNOSIS — E1122 Type 2 diabetes mellitus with diabetic chronic kidney disease: Secondary | ICD-10-CM

## 2022-06-13 DIAGNOSIS — I10 Essential (primary) hypertension: Secondary | ICD-10-CM

## 2022-06-13 DIAGNOSIS — I5022 Chronic systolic (congestive) heart failure: Secondary | ICD-10-CM

## 2022-06-13 NOTE — Telephone Encounter (Signed)
Call from pt stating his refills were sent to [facility]; he has not received them yet and he's almost out of medications. Informed pt his doctor can send a month supply to a local pharmacy. He's agreeable.  He needs Amlodipine, Atorvastatin, Wilder Glade, Entresto, and Spironolactone - send to [facility] on [address]. Thanks

## 2022-06-14 MED ORDER — ENTRESTO 24-26 MG PO TABS: 1.0000 | ORAL_TABLET | Freq: Two times a day (BID) | ORAL | 0 refills | Status: DC

## 2022-06-14 MED ORDER — SPIRONOLACTONE 25 MG PO TABS: 25.0000 mg | ORAL_TABLET | Freq: Every day | ORAL | 3 refills | Status: DC

## 2022-06-14 MED ORDER — DAPAGLIFLOZIN PROPANEDIOL 5 MG PO TABS: 5.0000 mg | ORAL_TABLET | Freq: Every day | ORAL | 2 refills | Status: DC

## 2022-06-14 MED ORDER — ATORVASTATIN CALCIUM 40 MG PO TABS: 40.0000 mg | ORAL_TABLET | Freq: Every day | ORAL | 3 refills | Status: DC

## 2022-06-14 MED ORDER — AMLODIPINE BESYLATE 10 MG PO TABS: 10.0000 mg | ORAL_TABLET | Freq: Every day | ORAL | 2 refills | Status: DC

## 2022-06-24 ENCOUNTER — Telehealth: Payer: Self-pay | Admitting: *Deleted

## 2022-06-24 ENCOUNTER — Encounter (HOSPITAL_BASED_OUTPATIENT_CLINIC_OR_DEPARTMENT_OTHER): Payer: Self-pay | Admitting: Urology

## 2022-06-24 NOTE — Progress Notes (Signed)
Spoke w/ via phone for pre-op interview--- pt Lab needs dos----  [employer] results------ current EKG in epic/ chart COVID test -----patient states asymptomatic no test needed Arrive at ------- 1100 on 06-25-2022 NPO after MN NO Solid Food.  Clear liquids from MN until--- 1000 Med rec completed Medications to take morning of surgery ----- coreg, lipitor, norvasc, entresto Diabetic medication ----- Patient instructed no nail polish to be worn day of surgery Patient instructed to bring photo id and insurance card day of surgery Patient aware to have Driver (ride ) / caregiver    for 24 hours after surgery -- chattie Patient Special Instructions ----- will do one fleet enema morning of surgery.  Asked pt to bring cpap/ mask/ tubing dos leave in car if needed by anesthesia nurse from recovery will have family to get from car Pre-Op special Istructions ----- pt has tele pre-op cardiac clearance by Ambrose Pancoast PA on 05-23-2022 in epic/ chart Patient verbalized understanding of instructions that were given at this phone interview. Patient denies shortness of breath, chest pain, fever, cough at this phone interview.   Anesthesia Review: severe HTN;  chronic HRrEF;  ICM current ef 50-55%;  DM 2;  CKD 3;  OSA stated uses cpap nightly  PCP:  Dr Allyson Sabal  (lov 05-22-2022 epic) Cardiologist :  (Dr Harriet Masson 03-19-2022) Chest x-ray : 05-11-2021 EKG : 05-23-2022 Echo : 05-15-2022 Stress test: no Cardiac Cath : no Activity level:  pt denies sob w/ any acitivity  Sleep Study/ CPAP : yes / yes Fasting Blood Sugar :      / Checks Blood Sugar -- times a day:  does not check Blood Thinner/ Instructions /Last Dose: no ASA / Instructions/ Last Dose : ASA '81mg'$ /  per pt stated last dose 06-20-2022,  stated only takes 2-3 times weekly does really remember anyone giving instructions to stop

## 2022-06-24 NOTE — Telephone Encounter (Signed)
Called patient to remind of procedure for 06-25-22, lvm for a return call

## 2022-06-24 NOTE — H&P (Signed)
Office Visit Report     06/18/2022   --------------------------------------------------------------------------------   Jason Mccarty  MRN: [id number]  DOB: August 01, 1960, 62 year old Male  SSN:    PRIMARY CARE:  Jason Mccarty, MBBS  PRIMARY CARE FAX:  [phone]  REFERRING:  Jason Mccarty, MBBS  PROVIDER:  Festus Mccarty, M.D.  TREATING:  Jason Mccarty, [place]  LOCATION:  [facility] [phone]     --------------------------------------------------------------------------------   CC/HPI: Pt presents today for pre-operative history and physical exam in anticipation of brachytherapy and space oar by Dr. Junious Mccarty on 06/25/22. He is doing well and is without complaint.   Pt denies F/C, HA, CP, SOB, N/V, diarrhea/constipation, back pain, flank pain, hematuria, and dysuria.     HX:   F/u -    1) PCa - dx with HR PCa Oct 2023 (PSA 13, T1 c, prostate 24 g, GG4, GG3, GG2 in 12/12). A September 2023 prostate MRI (endorectal) revealed an 18 mm PI-RADS 5 lesion left peripheral zone base to apex, the 14 mm PI-RADS 4 lesion right peripheral zone mid to apex, prostate was only 23 g. There was no obvious extracapsular extension but 1.5 cm capsular contact of ROI #1. Otherwise staging was negative.   Biopsy:  Oct 2023  PSA 13  T1 c  prostate 24 g  GG4, once core, 30%  GG3, three cores, 5-95%  GG2, eight cores, 60-95%  12/12   Staging:  September 2023 prostate MRI (endorectal) revealed an 18 mm PI-RADS 5 lesion left peripheral zone base to apex, the 14 mm PI-RADS 4 lesion right peripheral zone mid to apex, prostate was only 23 g. There was no obvious extracapsular extension but 1.5 cm capsular contact of ROI #1. Otherwise staging was negative.   No FH PCa. patient complained of urinary frequency urgency for several months. No blood thinners. He's had ED since 2022.   Today, seen for the above. He has a good stream and some frequency and urgency. He is on  tamsulosin. Some mild dysuria. He met with [facility]. He underwent PET scan which was benign, left prostate activity and equivocal area left thyroid cartilage. Here with his wife.   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 He does not take NTG. He had a MI. No stents.     ALLERGIES: Benadryl - Itching    MEDICATIONS: Aspirin 81 mg tablet,chewable  Metformin Hcl  Tamsulosin Hcl 0.4 mg capsule  Amlodipine Besylate  Atorvastatin Calcium  Carvedilol  Levofloxacin 750 mg tablet take 1 tablet po morning of procedure  Multivitamin  Sildenafil Citrate 20 mg tablet take 1 -5 tablets po about 1 hour before sexual activity  Sinus Med  Solifenacin Succinate 5 mg tablet 1 tablet PO Daily     Notes: Jason Mccarty    GU PSH: Prostate Needle Biopsy - 01/30/2022       PSH Notes: Umbilical hernia repair [id number],   NON-GU PSH: Surgical Pathology, Gross And Microscopic Examination For Prostate Needle - 01/30/2022     GU PMH: Prostate Cancer, Disc again stage, grade and prognosis. Disc PET scan. Ordered MRI neck. Disc surg and xrt. He elects to proceed with LT-ADT and XRT. I communicated with Dr. Tammi Mccarty. Orgovyx interaction with carvedilol. He was given Eligard 45 mg today. - 03/25/2022, - 02/22/2022, I had a long discussion with the patient using the understanding prostate cancer booklet and his path report as a reference. We discussed his stage, grade and prognosis. We discussed  the nature risks and benefits of active surveillance, radical prostatectomy, external beam radiotherapy, and brachytherapy. We discussed the role of androgen deprivation and chemotherapy in prostate cancer. We also discussed other ablative techniques such as HiFU and cryotherapy as well as whole gland versus focal treatment. We discussed specifically how each treatment might affect bowel, bladder and sexual function. We discussed how each treatment might effect salvage treatments and active surveillance might lead to progression  and more difficult treatment in the future. All questions answered. ALso disc gold seed and spaceoar. Will stage with a PET scan and refer to prostate cancer clinic. , - 02/06/2022 Urinary Frequency, add vsicare . urine for cx - 02/06/2022 Elevated PSA, he toerlated well. exam benign. Prostate 23 g - 01/30/2022, We discussed the nature, risks and benefits of PSA screening as well as the nature of elevated PSA (benign versus malignant). We discussed the possibility of prostate cancer and that as the PSA rises above the level of 2.5 and even over 1, the risk of prostate cancer on biopsy increases. We discussed the management of prostate cancer might include active surveillance or treatment depending on patient and cancer characteristics. In that context, we discussed the nature, risks and benefits of continued surveillance, other lab tests, transrectal ultrasound/prostate biopsy, or prostate MRI. All questions answered. Disc MRI findings can also be benign vs malignant. He will proceed with prostate bx. Given his prostate size and the size of the lesions, he doesn't need a specific fusion bx. PSA repeated. , - 01/02/2022 ED due to arterial insufficiency, disc nature r/b/a to pde5i - 01/02/2022    NON-GU PMH: Diabetes Type 2 Hypercholesterolemia Hypertension Myocardial Infarction    FAMILY HISTORY: Heart Attack - Father    Notes: 3 sons   SOCIAL HISTORY: Marital Status: Widowed Race: Black or African American Current Smoking Status: Patient smokes occasionally. Has smoked since 12/15/1991. Smokes 1/2 pack per day.   Tobacco Use Assessment Completed: Used Tobacco in last 30 days? Social Drinker.  Does not use drugs. Drinks 3 caffeinated drinks per day. Patient's occupation Geologist, engineering.     Notes: Trying to quit smoking  ETOH on occasion    REVIEW OF SYSTEMS:    GU Review Male:   Patient denies frequent urination, hard to postpone urination, burning/ pain with urination, get up at  night to urinate, leakage of urine, stream starts and stops, trouble starting your stream, have to strain to urinate , erection problems, and penile pain.  Gastrointestinal (Upper):   Patient denies nausea, vomiting, and indigestion/ heartburn.  Gastrointestinal (Lower):   Patient denies diarrhea and constipation.  Constitutional:   Patient denies fever, night sweats, weight loss, and fatigue.  Skin:   Patient denies skin rash/ lesion and itching.  Eyes:   Patient denies blurred vision and double vision.  Ears/ Nose/ Throat:   Patient denies sinus problems and sore throat.  Hematologic/Lymphatic:   Patient denies swollen glands and easy bruising.  Cardiovascular:   Patient denies leg swelling and chest pains.  Respiratory:   Patient denies cough and shortness of breath.  Endocrine:   Patient denies excessive thirst.  Musculoskeletal:   Patient denies back pain and joint pain.  Neurological:   Patient denies headaches and dizziness.  Psychologic:   Patient denies depression and anxiety.   VITAL SIGNS:      06/18/2022 02:44 PM  Weight 300 lb / 136.08 kg  Height 71 in / 180.34 cm  BP 136/82 mmHg  Pulse 82 /min  Temperature 98.0 F / 36.6 C  BMI 41.8 kg/m   MULTI-SYSTEM PHYSICAL EXAMINATION:    Constitutional: Well-nourished. No physical deformities. Normally developed. Good grooming.  Neck: Neck symmetrical, not swollen. Normal tracheal position.  Respiratory: Normal breath sounds. No labored breathing, no use of accessory muscles.   Cardiovascular: Regular rate and rhythm. No murmur, no gallop.   Lymphatic: No enlargement of neck, axillae, groin.  Skin: No paleness, no jaundice, no cyanosis. No lesion, no ulcer, no rash.  Neurologic / Psychiatric: Oriented to time, oriented to place, oriented to person. No depression, no anxiety, no agitation.  Gastrointestinal: No mass, no tenderness, no rigidity, obese abdomen.   Eyes: Normal conjunctivae. Normal eyelids.  Ears, Nose, Mouth, and  Throat: Left ear no scars, no lesions, no masses. Right ear no scars, no lesions, no masses. Nose no scars, no lesions, no masses. Normal hearing. Normal lips.  Musculoskeletal: Normal gait and station of head and neck.     Complexity of Data:  Records Review:   Previous Patient Records  Urine Test Review:   Urinalysis   06/18/22  Urinalysis  Urine Appearance Slightly Cloudy   Urine Color Yellow   Urine Glucose 3+ mg/dL  Urine Bilirubin Neg mg/dL  Urine Ketones Neg mg/dL  Urine Specific Gravity 1.015   Urine Blood Neg ery/uL  Urine pH 5.5   Urine Protein Neg mg/dL  Urine Urobilinogen 0.2 mg/dL  Urine Nitrites Neg   Urine Leukocyte Esterase 2+ leu/uL  Urine WBC/hpf 6 - 10/hpf   Urine RBC/hpf NS (Not Seen)   Urine Epithelial Cells 0 - 5/hpf   Urine Bacteria NS (Not Seen)   Urine Mucous Not Present   Urine Yeast NS (Not Seen)   Urine Trichomonas Not Present   Urine Cystals Amorph Urates   Urine Casts NS (Not Seen)   Urine Sperm Not Present    PROCEDURES:          Urinalysis w/Scope - 81001 Dipstick Dipstick Cont'd Micro  Color: Yellow Bilirubin: Neg mg/dL WBC/hpf: 6 - 10/hpf  Appearance: Slightly Cloudy Ketones: Neg mg/dL RBC/hpf: NS (Not Seen)  Specific Gravity: 1.015 Blood: Neg ery/uL Bacteria: NS (Not Seen)  pH: 5.5 Protein: Neg mg/dL Cystals: Amorph Urates  Glucose: 3+ mg/dL Urobilinogen: 0.2 mg/dL Casts: NS (Not Seen)    Nitrites: Neg Trichomonas: Not Present    Leukocyte Esterase: 2+ leu/uL Mucous: Not Present      Epithelial Cells: 0 - 5/hpf      Yeast: NS (Not Seen)      Sperm: Not Present    ASSESSMENT:      ICD-10 Details  1 GU:   Prostate Cancer - C61    PLAN:           Orders Labs Urine Culture          Schedule Return Visit/Planned Activity: Keep Scheduled Appointment - Schedule Surgery          Document Letter(s):  Created for Patient: Clinical Summary         Notes:   There are no changes in the patients history or physical exam since last  evaluation by Dr. Junious Mccarty. Pt is scheduled to undergo brachytherapy and space oar on 06/25/22.   Culture to r/o infection   All pt's questions were answered to the best of my ability.            Next Appointment:      Next Appointment: 06/25/2022 01:00 PM    Appointment Type: Surgery  Location: [facility] [phone]    Provider: Festus Mccarty, M.D.    Reason for Visit: OP NE SEEDS SPACE OAR      * Signed by Jason Rossetti, PA on 06/18/22 at 3:00 PM (EST*      The information contained in this medical record document is considered private and confidential patient information. This information can only be used for the medical diagnosis and/or medical services that are being provided by the patient's selected caregivers. This information can only be distributed outside of the patient's care if the patient agrees and signs waivers of authorization for this information to be sent to an outside source or route.

## 2022-06-25 ENCOUNTER — Ambulatory Visit (HOSPITAL_COMMUNITY): Payer: BLUE CROSS/BLUE SHIELD

## 2022-06-25 ENCOUNTER — Ambulatory Visit (HOSPITAL_BASED_OUTPATIENT_CLINIC_OR_DEPARTMENT_OTHER): Payer: BLUE CROSS/BLUE SHIELD | Admitting: Anesthesiology

## 2022-06-25 ENCOUNTER — Other Ambulatory Visit: Payer: Self-pay

## 2022-06-25 ENCOUNTER — Ambulatory Visit (HOSPITAL_BASED_OUTPATIENT_CLINIC_OR_DEPARTMENT_OTHER)
Admission: RE | Admit: 2022-06-25 | Discharge: 2022-06-25 | Disposition: A | Discharge status: Home / Self Care | Payer: BLUE CROSS/BLUE SHIELD | Attending: Urology | Admitting: Urology

## 2022-06-25 ENCOUNTER — Encounter (HOSPITAL_BASED_OUTPATIENT_CLINIC_OR_DEPARTMENT_OTHER)
Admission: RE | Disposition: A | Discharge status: Home / Self Care | Payer: Self-pay | Source: Home / Self Care | Attending: Urology

## 2022-06-25 ENCOUNTER — Encounter (HOSPITAL_BASED_OUTPATIENT_CLINIC_OR_DEPARTMENT_OTHER): Payer: Self-pay | Admitting: Urology

## 2022-06-25 DIAGNOSIS — Z79899 Other long term (current) drug therapy: Secondary | ICD-10-CM | POA: Diagnosis not present

## 2022-06-25 DIAGNOSIS — C61 Malignant neoplasm of prostate: Secondary | ICD-10-CM | POA: Insufficient documentation

## 2022-06-25 DIAGNOSIS — Z7984 Long term (current) use of oral hypoglycemic drugs: Secondary | ICD-10-CM | POA: Insufficient documentation

## 2022-06-25 DIAGNOSIS — Z01818 Encounter for other preprocedural examination: Secondary | ICD-10-CM

## 2022-06-25 DIAGNOSIS — F17210 Nicotine dependence, cigarettes, uncomplicated: Secondary | ICD-10-CM | POA: Diagnosis not present

## 2022-06-25 DIAGNOSIS — Z6841 Body Mass Index (BMI) 40.0 and over, adult: Secondary | ICD-10-CM | POA: Insufficient documentation

## 2022-06-25 DIAGNOSIS — G4730 Sleep apnea, unspecified: Secondary | ICD-10-CM | POA: Insufficient documentation

## 2022-06-25 DIAGNOSIS — I10 Essential (primary) hypertension: Secondary | ICD-10-CM | POA: Insufficient documentation

## 2022-06-25 DIAGNOSIS — E119 Type 2 diabetes mellitus without complications: Secondary | ICD-10-CM | POA: Insufficient documentation

## 2022-06-25 HISTORY — DX: Presence of spectacles and contact lenses: Z97.3

## 2022-06-25 HISTORY — DX: Obstructive sleep apnea (adult) (pediatric): G47.33

## 2022-06-25 HISTORY — DX: Localized edema: R60.0

## 2022-06-25 HISTORY — DX: Essential (primary) hypertension: I10

## 2022-06-25 HISTORY — DX: Benign prostatic hyperplasia with lower urinary tract symptoms: N40.1

## 2022-06-25 HISTORY — DX: Presence of dental prosthetic device (complete) (partial): Z97.2

## 2022-06-25 HISTORY — PX: SPACE OAR INSTILLATION: SHX6769

## 2022-06-25 HISTORY — DX: Chronic kidney disease, stage 3 unspecified: N18.30

## 2022-06-25 HISTORY — PX: RADIOACTIVE SEED IMPLANT: SHX5150

## 2022-06-25 LAB — POCT I-STAT, CHEM 8
BUN: 23 mg/dL (ref 8–23)
Calcium, Ion: 1.23 mmol/L (ref 1.15–1.40)
Chloride: 102 mmol/L (ref 98–111)
Creatinine, Ser: 1.8 mg/dL — ABNORMAL HIGH (ref 0.61–1.24)
Glucose, Bld: 162 mg/dL — ABNORMAL HIGH (ref 70–99)
HCT: 37 % — ABNORMAL LOW (ref 39.0–52.0)
Hemoglobin: 12.6 g/dL — ABNORMAL LOW (ref 13.0–17.0)
Potassium: 4.2 mmol/L (ref 3.5–5.1)
Sodium: 139 mmol/L (ref 135–145)
TCO2: 24 mmol/L (ref 22–32)

## 2022-06-25 SURGERY — INSERTION, RADIATION SOURCE, PROSTATE
Anesthesia: General | Site: Prostate

## 2022-06-25 MED ORDER — SODIUM CHLORIDE 0.9 % IV SOLN: INTRAVENOUS | Status: DC

## 2022-06-25 MED ORDER — FLEET ENEMA 7-19 GM/118ML RE ENEM: 1.0000 | ENEMA | Freq: Once | RECTAL | Status: DC

## 2022-06-25 MED ORDER — MIDAZOLAM HCL 2 MG/2ML IJ SOLN
INTRAMUSCULAR | Status: AC
  Filled 2022-06-25: qty 2

## 2022-06-25 MED ORDER — GLYCOPYRROLATE PF 0.2 MG/ML IJ SOSY
PREFILLED_SYRINGE | INTRAMUSCULAR | Status: AC
  Filled 2022-06-25: qty 1

## 2022-06-25 MED ORDER — GLYCOPYRROLATE PF 0.2 MG/ML IJ SOSY
PREFILLED_SYRINGE | INTRAMUSCULAR | Status: DC | PRN
  Administered 2022-06-25: .2 mg via INTRAVENOUS

## 2022-06-25 MED ORDER — CIPROFLOXACIN IN D5W 400 MG/200ML IV SOLN
400.0000 mg | INTRAVENOUS | Status: AC
  Administered 2022-06-25: 400 mg via INTRAVENOUS

## 2022-06-25 MED ORDER — CIPROFLOXACIN IN D5W 400 MG/200ML IV SOLN
INTRAVENOUS | Status: AC
  Filled 2022-06-25: qty 200

## 2022-06-25 MED ORDER — PROPOFOL 10 MG/ML IV BOLUS
INTRAVENOUS | Status: DC | PRN
  Administered 2022-06-25: 20 mg via INTRAVENOUS
  Administered 2022-06-25: 200 mg via INTRAVENOUS

## 2022-06-25 MED ORDER — FENTANYL CITRATE (PF) 100 MCG/2ML IJ SOLN
INTRAMUSCULAR | Status: DC | PRN
  Administered 2022-06-25: 25 ug via INTRAVENOUS
  Administered 2022-06-25: 50 ug via INTRAVENOUS

## 2022-06-25 MED ORDER — IOHEXOL 300 MG/ML  SOLN
INTRAMUSCULAR | Status: DC | PRN
  Administered 2022-06-25: 7 mL

## 2022-06-25 MED ORDER — ROCURONIUM BROMIDE 10 MG/ML (PF) SYRINGE
PREFILLED_SYRINGE | INTRAVENOUS | Status: AC
  Filled 2022-06-25: qty 10

## 2022-06-25 MED ORDER — ONDANSETRON HCL 4 MG/2ML IJ SOLN
INTRAMUSCULAR | Status: DC | PRN
  Administered 2022-06-25: 4 mg via INTRAVENOUS

## 2022-06-25 MED ORDER — MIDAZOLAM HCL 2 MG/2ML IJ SOLN
INTRAMUSCULAR | Status: DC | PRN
  Administered 2022-06-25: 1 mg via INTRAVENOUS

## 2022-06-25 MED ORDER — PHENYLEPHRINE 80 MCG/ML (10ML) SYRINGE FOR IV PUSH (FOR BLOOD PRESSURE SUPPORT)
PREFILLED_SYRINGE | INTRAVENOUS | Status: DC | PRN
  Administered 2022-06-25 (×2): 80 ug via INTRAVENOUS

## 2022-06-25 MED ORDER — ORAL CARE MOUTH RINSE: 15.0000 mL | Freq: Once | OROMUCOSAL | Status: DC

## 2022-06-25 MED ORDER — FENTANYL CITRATE (PF) 100 MCG/2ML IJ SOLN
INTRAMUSCULAR | Status: AC
  Filled 2022-06-25: qty 2

## 2022-06-25 MED ORDER — ACETAMINOPHEN 500 MG PO TABS
ORAL_TABLET | ORAL | Status: AC
  Filled 2022-06-25: qty 2

## 2022-06-25 MED ORDER — LIDOCAINE 2% (20 MG/ML) 5 ML SYRINGE
INTRAMUSCULAR | Status: DC | PRN
  Administered 2022-06-25: 100 mg via INTRAVENOUS

## 2022-06-25 MED ORDER — LIDOCAINE HCL (PF) 2 % IJ SOLN
INTRAMUSCULAR | Status: AC
  Filled 2022-06-25: qty 5

## 2022-06-25 MED ORDER — DEXAMETHASONE SODIUM PHOSPHATE 10 MG/ML IJ SOLN
INTRAMUSCULAR | Status: AC
  Filled 2022-06-25: qty 1

## 2022-06-25 MED ORDER — SODIUM CHLORIDE (PF) 0.9 % IJ SOLN
INTRAMUSCULAR | Status: DC | PRN
  Administered 2022-06-25: 10 mL

## 2022-06-25 MED ORDER — ROCURONIUM BROMIDE 10 MG/ML (PF) SYRINGE
PREFILLED_SYRINGE | INTRAVENOUS | Status: DC | PRN
  Administered 2022-06-25: 10 mg via INTRAVENOUS
  Administered 2022-06-25: 60 mg via INTRAVENOUS

## 2022-06-25 MED ORDER — ACETAMINOPHEN 500 MG PO TABS
1000.0000 mg | ORAL_TABLET | Freq: Once | ORAL | Status: AC
  Administered 2022-06-25: 1000 mg via ORAL

## 2022-06-25 MED ORDER — ONDANSETRON HCL 4 MG/2ML IJ SOLN
INTRAMUSCULAR | Status: AC
  Filled 2022-06-25: qty 2

## 2022-06-25 MED ORDER — STERILE WATER FOR IRRIGATION IR SOLN
Status: DC | PRN
  Administered 2022-06-25: 500 mL

## 2022-06-25 MED ORDER — SUGAMMADEX SODIUM 200 MG/2ML IV SOLN
INTRAVENOUS | Status: DC | PRN
  Administered 2022-06-25: 200 mg via INTRAVENOUS

## 2022-06-25 MED ORDER — PROPOFOL 10 MG/ML IV BOLUS
INTRAVENOUS | Status: AC
  Filled 2022-06-25: qty 20

## 2022-06-25 MED ORDER — SODIUM CHLORIDE 0.9 % IR SOLN
Status: DC | PRN
  Administered 2022-06-25: 1000 mL

## 2022-06-25 MED ORDER — OXYCODONE HCL 5 MG/5ML PO SOLN: 5.0000 mg | Freq: Once | ORAL | Status: DC | PRN

## 2022-06-25 MED ORDER — EPHEDRINE SULFATE-NACL 50-0.9 MG/10ML-% IV SOSY
PREFILLED_SYRINGE | INTRAVENOUS | Status: DC | PRN
  Administered 2022-06-25 (×2): 5 mg via INTRAVENOUS

## 2022-06-25 MED ORDER — FENTANYL CITRATE (PF) 100 MCG/2ML IJ SOLN
25.0000 ug | INTRAMUSCULAR | Status: DC | PRN
  Administered 2022-06-25: 50 ug via INTRAVENOUS

## 2022-06-25 MED ORDER — DEXAMETHASONE SODIUM PHOSPHATE 10 MG/ML IJ SOLN
INTRAMUSCULAR | Status: DC | PRN
  Administered 2022-06-25: 5 mg via INTRAVENOUS

## 2022-06-25 MED ORDER — CHLORHEXIDINE GLUCONATE 0.12 % MT SOLN: 15.0000 mL | Freq: Once | OROMUCOSAL | Status: DC

## 2022-06-25 MED ORDER — LACTATED RINGERS IV SOLN: INTRAVENOUS | Status: DC

## 2022-06-25 MED ORDER — PHENYLEPHRINE 80 MCG/ML (10ML) SYRINGE FOR IV PUSH (FOR BLOOD PRESSURE SUPPORT)
PREFILLED_SYRINGE | INTRAVENOUS | Status: AC
  Filled 2022-06-25: qty 10

## 2022-06-25 MED ORDER — OXYCODONE HCL 5 MG PO TABS: 5.0000 mg | ORAL_TABLET | Freq: Once | ORAL | Status: DC | PRN

## 2022-06-25 MED ORDER — AMISULPRIDE (ANTIEMETIC) 5 MG/2ML IV SOLN: 10.0000 mg | Freq: Once | INTRAVENOUS | Status: DC | PRN

## 2022-06-25 SURGICAL SUPPLY — 39 items
BAG DRN RND TRDRP ANRFLXCHMBR (UROLOGICAL SUPPLIES) ×1
BAG URINE DRAIN 2000ML AR STRL (UROLOGICAL SUPPLIES) ×1 IMPLANT
BLADE CLIPPER SENSICLIP SURGIC (BLADE) ×1 IMPLANT
CATH FOLEY 2WAY SLVR  5CC 16FR (CATHETERS) ×1
CATH FOLEY 2WAY SLVR 5CC 16FR (CATHETERS) ×1 IMPLANT
CATH ROBINSON RED A/P 20FR (CATHETERS) ×1 IMPLANT
CLOTH BEACON ORANGE TIMEOUT ST (SAFETY) ×1 IMPLANT
CNTNR URN SCR LID CUP LEK RST (MISCELLANEOUS) ×1 IMPLANT
CONT SPEC 4OZ STRL OR WHT (MISCELLANEOUS) ×1
COVER BACK TABLE 60X90IN (DRAPES) ×1 IMPLANT
COVER MAYO STAND STRL (DRAPES) ×1 IMPLANT
DRSG TEGADERM 4X4.75 (GAUZE/BANDAGES/DRESSINGS) ×1 IMPLANT
DRSG TEGADERM 8X12 (GAUZE/BANDAGES/DRESSINGS) ×1 IMPLANT
GAUZE SPONGE 4X4 12PLY STRL (GAUZE/BANDAGES/DRESSINGS) IMPLANT
GEL ULTRASOUND 20GR AQUASONIC (MISCELLANEOUS) ×2 IMPLANT
GLOVE BIO SURGEON STRL SZ7.5 (GLOVE) ×1 IMPLANT
GLOVE BIOGEL PI IND STRL 6.5 (GLOVE) IMPLANT
GLOVE BIOGEL PI IND STRL 7.0 (GLOVE) IMPLANT
GLOVE SURG ORTHO 8.5 STRL (GLOVE) ×1 IMPLANT
GOWN STRL REUS W/TWL LRG LVL3 (GOWN DISPOSABLE) ×1 IMPLANT
GRID BRACH TEMP 18GA 2.8X3X.75 (MISCELLANEOUS) ×1 IMPLANT
I 125 Seeds IMPLANT
IMPL SPACEOAR VUE SYSTEM (Spacer) ×1 IMPLANT
IMPLANT SPACEOAR VUE SYSTEM (Spacer) ×1 IMPLANT
IV NS 1000ML (IV SOLUTION) ×1
IV NS 1000ML BAXH (IV SOLUTION) ×1 IMPLANT
KIT TURNOVER CYSTO (KITS) ×1 IMPLANT
NDL BRACHY 18G 5PK (NEEDLE) ×4 IMPLANT
NDL PK MORGANSTERN STABILIZ (NEEDLE) ×1 IMPLANT
NEEDLE BRACHY 18G 5PK (NEEDLE) ×4 IMPLANT
NEEDLE PK MORGANSTERN STABILIZ (NEEDLE) ×2 IMPLANT
PACK CYSTO (CUSTOM PROCEDURE TRAY) ×1 IMPLANT
SHEATH ULTRASOUND LTX NONSTRL (SHEATH) IMPLANT
SLEEVE SCD COMPRESS KNEE MED (STOCKING) ×1 IMPLANT
SYR 10ML LL (SYRINGE) ×1 IMPLANT
SYR CONTROL 10ML LL (SYRINGE) ×1 IMPLANT
TOWEL OR 17X24 6PK STRL BLUE (TOWEL DISPOSABLE) ×1 IMPLANT
UNDERPAD 30X36 HEAVY ABSORB (UNDERPADS AND DIAPERS) ×2 IMPLANT
WATER STERILE IRR 500ML POUR (IV SOLUTION) ×1 IMPLANT

## 2022-06-25 NOTE — Op Note (Signed)
Preoperative diagnosis: Prostate Cancer Postoperative diagnosis: Same   Procedure: Prostate brachytherapy seed implant, SpaceOAR biodegradable gel insertion, Cystoscopy  Surgeon: Junious Silk   Radiation oncologist: Tammi Klippel   Anesthesia: Gen.   Indication for procedure: 62 year old with high risk prostate cancer who elected to proceed with prostate brachytherapy boost.   Findings: On fluoroscopic imaging there was adequate coverage of the prostate. On cystoscopy the urethra appeared normal, the prostatic urethra appeared normal, the trigone and ureteral orifices appeared normal with clear efflux. The bladder mucosa appeared normal. There were no stones, foreign bodies or seeds visualized in the bladder.   Dose:***   Description of procedure: After consent was obtained patient brought to the operating room. After adequate anesthesia he is placed in lithotomy position and the transrectal ultrasound probe and perineal template positioned. Catheters and brachytherapy seeds were placed per Dr. Johny Shears plan. A total of *** catheters and *** active sources (I-125) were placed. The anchoring needles, template and ultrasound were removed. Scout flouro imaging was obtained of the implant. The Foley was removed.  Another image was obtained.    The 18-gauge needle was then inserted approximately 1 to 2 cm anterior to the anal opening and directed under ultrasonic guidance into the perirectal fat between the anterior rectal wall and the prostate capsule down to the mid-gland. Midline needle position was confirmed in the sagittal and axial views to verify the tip was in the perirectal fat.  Small amounts of saline were injected to hydrodissect the space between the prostate and the anterior rectal wall.  Axial imaging was viewed to confirm the needle was in the correct location in the mid gland and centered.  Aspiration confirmed no intravascular access.  The saline syringe was carefully disconnected maintaining  the desired needle position and the hydrogel was attached to the needle.  Under ultrasound guidance in the sagittal view a smooth continuous injection was done over about 12 seconds delivering the hydrogel into the space between the prostate and rectal wall.  The needle was withdrawn.  The patient was prepped again and cystoscopy was performed which was noted to be normal. He was awakened taken to the recovery room in stable condition.   Complications: None   Blood loss: Minimal   Specimens: None   Drains: None    Disposition: Patient stable to PACU.

## 2022-06-25 NOTE — Anesthesia Preprocedure Evaluation (Addendum)
Anesthesia Evaluation  Patient identified by MRN, date of birth, ID band Patient awake    Reviewed: Allergy & Precautions, NPO status , Patient's Chart, lab work & pertinent test results, reviewed documented beta blocker date and time   History of Anesthesia Complications Negative for: history of anesthetic complications  Airway Mallampati: III  TM Distance: >3 FB Neck ROM: Full    Dental  (+) Missing,    Pulmonary sleep apnea and Continuous Positive Airway Pressure Ventilation , Current Smoker and Patient abstained from smoking.   Pulmonary exam normal        Cardiovascular hypertension, Pt. on medications and Pt. on home beta blockers Normal cardiovascular exam  TTE 05/15/2021: EF 50-55%, mild LVH, grade I DD, valves ok    Neuro/Psych negative neurological ROS     GI/Hepatic negative GI ROS, Neg liver ROS,,,  Endo/Other  diabetes, Type 2, Oral Hypoglycemic Agents  Morbid obesity  Renal/GU Renal InsufficiencyRenal disease     Musculoskeletal negative musculoskeletal ROS (+)    Abdominal   Peds  Hematology negative hematology ROS (+)   Anesthesia Other Findings Prostate ca  Reproductive/Obstetrics                              Anesthesia Physical Anesthesia Plan  ASA: 3  Anesthesia Plan: General   Post-op Pain Management: Tylenol PO (pre-op)*   Induction: Intravenous  PONV Risk Score and Plan: 1 and Treatment may vary due to age or medical condition, Midazolam, Dexamethasone and Ondansetron  Airway Management Planned: Oral ETT  Additional Equipment: None  Intra-op Plan:   Post-operative Plan: Extubation in OR  Informed Consent: I have reviewed the patients History and Physical, chart, labs and discussed the procedure including the risks, benefits and alternatives for the proposed anesthesia with the patient or authorized representative who has indicated his/her understanding  and acceptance.     Dental advisory given  Plan Discussed with: CRNA  Anesthesia Plan Comments:          Anesthesia Quick Evaluation

## 2022-06-25 NOTE — Anesthesia Procedure Notes (Signed)
Procedure Name: Intubation Date/Time: 06/25/2022 2:30 PM  Performed by: Mechele Claude, CRNAPre-anesthesia Checklist: Patient identified, Emergency Drugs available, Suction available and Patient being monitored Patient Re-evaluated:Patient Re-evaluated prior to induction Oxygen Delivery Method: Circle System Utilized Preoxygenation: Pre-oxygenation with 100% oxygen Induction Type: IV induction Ventilation: Mask ventilation without difficulty Laryngoscope Size: Mac and 4 Grade View: Grade II Tube type: Oral Tube size: 7.5 mm Number of attempts: 1 Airway Equipment and Method: Stylet Placement Confirmation: ETT inserted through vocal cords under direct vision, positive ETCO2 and breath sounds checked- equal and bilateral Secured at: 22 cm Tube secured with: Tape Dental Injury: Teeth and Oropharynx as per pre-operative assessment

## 2022-06-25 NOTE — Interval H&P Note (Signed)
History and Physical Interval Note:  06/25/2022 2:24 PM  Also discussed the MRI of the neck with [facility].  He knows he needs it and said he is waiting on someone to call him to schedule.  I saw where Dr. Jodell Cipro  Had ordered the study.  I told Taygen to contact Dr. Jodell Cipro or radiology to schedule.  I will also message the nurse navigator to see see if she can help him.  [name]

## 2022-06-25 NOTE — Interval H&P Note (Signed)
History and Physical Interval Note:  06/25/2022 2:10 PM  Jason Mccarty  has presented today for surgery, with the diagnosis of PROSTATE CANCER.  The various methods of treatment have been discussed with the patient and family. After consideration of risks, benefits and other options for treatment, the patient has consented to  Procedure(s) with comments: RADIOACTIVE SEED IMPLANT/BRACHYTHERAPY IMPLANT (N/A) SPACE OAR INSTILLATION (N/A) - 90 MINS FOR CASE  PUT IN ROOM 1 OR 3 as a surgical intervention. He is well. No dysuria or gross hematuria. No fever. Discussed again nature, role, r/b/a to brachytherapy and Spaceoar gel. Disc risk of urethral injury and rectal injury among others.   The patient's history has been reviewed, patient examined, no change in status, stable for surgery.  I have reviewed the patient's chart and labs.  Questions were answered to the patient's satisfaction.     Jason Mccarty

## 2022-06-25 NOTE — Transfer of Care (Signed)
Immediate Anesthesia Transfer of Care Note  Patient: Olaf Deily  Procedure(s) Performed: RADIOACTIVE SEED IMPLANT/BRACHYTHERAPY IMPLANT (Prostate) SPACE OAR INSTILLATION (Prostate)  Patient Location: PACU  Anesthesia Type:General  Level of Consciousness: awake, alert , oriented, and patient cooperative  Airway & Oxygen Therapy: Patient Spontanous Breathing and Patient connected to face mask oxygen  Post-op Assessment: Report given to RN and Post -op Vital signs reviewed and stable  Post vital signs: Reviewed and stable  Last Vitals:  Vitals Value Taken Time  BP 142/82 06/25/22 1537  Temp    Pulse 70 06/25/22 1540  Resp 20 06/25/22 1540  SpO2 95 % 06/25/22 1540  Vitals shown include unvalidated device data.  Last Pain:  Vitals:   06/25/22 1113  TempSrc: Oral  PainSc: 0-No pain      Patients Stated Pain Goal: 6 ([id number])  Complications: No notable events documented.

## 2022-06-25 NOTE — Discharge Instructions (Signed)
No acetaminophen/Tylenol until after 5:20 pm today if needed.   Post Anesthesia Home Care Instructions  Activity: Get plenty of rest for the remainder of the day. A responsible individual must stay with you for 24 hours following the procedure.  For the next 24 hours, DO NOT: -Drive a car -Paediatric nurse -Drink alcoholic beverages -Take any medication unless instructed by your physician -Make any legal decisions or sign important papers.  Meals: Start with liquid foods such as gelatin or soup. Progress to regular foods as tolerated. Avoid greasy, spicy, heavy foods. If nausea and/or vomiting occur, drink only clear liquids until the nausea and/or vomiting subsides. Call your physician if vomiting continues.  Special Instructions/Symptoms: Your throat may feel dry or sore from the anesthesia or the breathing tube placed in your throat during surgery. If this causes discomfort, gargle with warm salt water. The discomfort should disappear within 24 hours.         PROSTATE CANCER TREATMENT WITH RADIOACTIVE IODINE-125 SEED IMPLANT  This instruction sheet is intended to discuss implantation of Iodine-125 seeds as treatment for cancer of the prostate. It will explain in detail what you may expect from this treatment and what precautions are necessary as a result of the treatment. Iodine-125 emits a relatively low energy radiation. The radioactive seeds are surgically implanted directly into the prostate gland. Most of the radiation is contained within the prostate gland. A very small amount is present outside the body.The precautions that we ask you to take are to ensure that those around you are protected from unnecessary radiation. The principles of radiation safety that you need to understand are:  DISTANCE: The further a person is from the radioactive implant the less radiation they will be receiving. The amount of radiation received falls off quite rapidly with distance. More specific  guidelines are given in the table on the last page.  TIME: The amount of radiation a person is exposed to is directly proportional to the amount of time that is spent in close proximity to the radioactive implant. Very little radiation will be received during short periods. See the table on the last page for more specific guideline.  CHILDREN UNDER AGE 67 Children should not be allowed to sit on your lap or otherwise be in very close contact for more than a few minutes for the first 6-8 weeks following the implant. You may affectionately greet (hug/kiss) a child for a short period of time, but remember, the longer you are in close proximity with that child the more radiation they are being exposed to. At a distance of 6 feet there is no limit to the length of time you may spend together. See specific guidelines on the last page.  PREGNANT OR POSSIBLY PREGNANT WOMEN Pregnant women should avoid prolonged close physical contact with you for the first 6-8 weeks after implant. At a distance of 6 feet there is no limit to the length of time you may spend together. Pregnant women or possibly pregnant women can safely be in close contact with you for a limited period of time. See the last page for guidelines.  FAMILY RELATIONS You may sleep in the same bed as your partner (provided she is not pregnant or under the age of 61). Sexual intercourse, using a condom, may be resumed 2 weeks after the implant. Your semen may be discolored, dark brown or black. This is normal and is the result of bleeding that may have occurred during the implant. After 3-4 weeks it  will not be necessary to use a condom.  DAILY ACTIVITIES You may resume normal activities in a few days (example: work, shopping, church) without the risk of harmful radiation exposure to those around you provided you keep in mind the time and distance precautions. Objects that you touch or item that you use do not become radioactive. Linens, clothing,  tableware, and dishes may be used by other persons without special precautions. Your bodily wastes (urine and stool) are not radioactive.  SPECIAL PRECAUTIONS It is possible to lose implanted Iodine-125 seed(s) through urination. Although it is possible to pass seeds indefinitely, it is most likely to occur immediately after catheter removal. To prevent this from happening the catheter that was in place during the implant procedure is removed immediately after the implant and a cystoscopy procedure is performed. The process of removing the catheter and the cystoscopy procedure should dislodge and remove any seeds that are not firmly imbedded in the prostate tissue. However, you should watch for seeds if/when you remove your catheter at home. The seeds are silver colored and the size of a grain of rice. In the unlikely event that a seed is seen after urination, simply flush the seed down the toilet. The seed should not be handled with your fingers, not even with a glove or napkin. A spoon or tweezers can be used to pick up a seed. The Radiation Oncology department is open Monday - Friday from 8:00 am to 5:30 pm with a Radiation Oncologist on call at all times. He or she may be reached by calling [phone]. If you are to be hospitalized or if death should occur, your family should notify the Runner, broadcasting/film/video.  SIDE EFFECTS There are very few side effects associate with the implant procedure. Minor burning with urination, weak stream, hesitancy, intermittency, frequency, mild pain or feeling unable to pass your urine freely are common and usually stop in one to four months. If these symptoms are extremely uncomfortable, contact your physician.  RADIATION SAFETY GUIDELINES PROSTATE CANCER TREATMENT WITH RADIOACTIVE IODINE-125 SEED IMPLANT  The following guidelines will limit exposure to less than naturally occurring background radiation.  PERSONS AGE 34-45 (if able to become pregnant)  FOR 8  WEEKS FOLLOWING IMPLANT  At a distance of 1 foot: limit time to less than 2 hours/week At a distance of 3 feet: limit time to 20 hours/week At a distance of 6 feet: no restrictions  AFTER 8 WEEKS No restrictions  CHILDREN UNDER AGE 34, PREGNANT WOMEN OR POSSIBLY PREGNANT WOMEN  FOR 8 WEEKS FOLLOWING IMPLANT At a distance of 1 foot: limit time to 10 minutes/week At a distance of 3 feet: limit time to 2 hours/week At a distance of 6 feet: no restrictions  AFTER 8 WEEKS No restrictions  PERSONS OVER THE AGE OF 45 AND DO NOT EXPECT TO HAVE ANY MORE CHILDREN No restrictions  Updated by SCP in January 2020

## 2022-06-25 NOTE — Procedures (Signed)
  Radiation Oncology         [phone] ________________________________  Name: Shia Eber MRN: [id number]  Date: 06/25/2022  DOB: 11-Dec-1960       Prostate Seed Implant  KC:[email or web address], Soyla Murphy, MD  No ref. provider found  DIAGNOSIS:  62 y.o. gentleman with stage T1c adenocarcinoma of the prostate with a Gleason's score of 4+4 and a PSA of 10   Oncology History  Malignant neoplasm of prostate ([place])  01/30/2022 Cancer Staging   Staging form: Prostate, AJCC 8th Edition - Clinical stage from 01/30/2022: Stage IIC (cT1c, cN0, cM0, PSA: 10, Grade Group: 4) - Signed by Freeman Caldron, PA-C on 05/23/2022 Histopathologic type: Adenocarcinoma, NOS Stage prefix: Initial diagnosis Prostate specific antigen (PSA) range: 10 to 19 Gleason primary pattern: 4 Gleason secondary pattern: 4 Gleason score: 8 Histologic grading system: 5 grade system Number of biopsy cores examined: 12 Number of biopsy cores positive: 12 Location of positive needle core biopsies: Both sides   02/22/2022 Initial Diagnosis   Malignant neoplasm of prostate ([facility])     No diagnosis found.  PROCEDURE: Insertion of radioactive I-125 seeds into the prostate gland.  RADIATION DOSE: 110 Gy, boost therapy.  TECHNIQUE: Naheim Burgen was brought to the operating room with the urologist. He was placed in the dorsolithotomy position. He was catheterized and a rectal tube was inserted. The perineum was shaved, prepped and draped. The ultrasound probe was then introduced by me into the rectum to see the prostate gland.  TREATMENT DEVICE: I attached the needle grid to the ultrasound probe stand and anchor needles were placed.  3D PLANNING: The prostate was imaged in 3D using a sagittal sweep of the prostate probe. These images were transferred to the planning computer. There, the prostate, urethra and rectum were defined on each axial reconstructed image. Then, the software created an optimized 3D plan and a few seed positions were  adjusted. The quality of the plan was reviewed using [facility] information for the target and the following two organs at risk:  Urethra and Rectum.  Then the accepted plan was printed and handed off to the radiation therapist.  Under my supervision, the custom loading of the seeds and spacers was carried out using the quick loader.  These pre-loaded needles were then placed into the needle holder.Marland Kitchen  PROSTATE VOLUME STUDY:  Using transrectal ultrasound the volume of the prostate was verified to be 20 cc.  SPECIAL TREATMENT PROCEDURE/SUPERVISION AND HANDLING: The pre-loaded needles were then delivered by the urologist under sagittal guidance. A total of 14 needles were used to deposit 52 seeds in the prostate gland. The individual seed activity was 0.293 mCi.  SpaceOAR: Yes  COMPLEX SIMULATION: At the end of the procedure, an anterior radiograph of the pelvis was obtained to document seed positioning and count. Cystoscopy was performed by the urologist to check the urethra and bladder.  MICRODOSIMETRY: At the end of the procedure, the patient was emitting 0.045 mR/hr at 1 meter. Accordingly, he was considered safe for hospital discharge.  PLAN: The patient will return to the radiation oncology clinic for post implant CT dosimetry in three weeks.   ________________________________  Sheral Apley Tammi Klippel, M.D.

## 2022-06-26 NOTE — Anesthesia Postprocedure Evaluation (Signed)
Anesthesia Post Note  Patient: Jason Mccarty  Procedure(s) Performed: RADIOACTIVE SEED IMPLANT/BRACHYTHERAPY IMPLANT (Prostate) SPACE OAR INSTILLATION (Prostate)     Patient location during evaluation: PACU Anesthesia Type: General Level of consciousness: sedated and patient cooperative Pain management: pain level controlled Vital Signs Assessment: post-procedure vital signs reviewed and stable Respiratory status: spontaneous breathing Cardiovascular status: stable Anesthetic complications: no   No notable events documented.  Last Vitals:  Vitals:   06/25/22 1615 06/25/22 1630  BP: 136/80 127/66  Pulse: 62 62  Resp: 13 14  Temp:    SpO2: 94% 94%    Last Pain:  Vitals:   06/25/22 1630  TempSrc:   PainSc: 0-No pain                 Nolon Nations

## 2022-06-27 ENCOUNTER — Ambulatory Visit: Payer: BLUE CROSS/BLUE SHIELD | Admitting: Cardiology

## 2022-06-27 ENCOUNTER — Encounter (HOSPITAL_BASED_OUTPATIENT_CLINIC_OR_DEPARTMENT_OTHER): Payer: Self-pay | Admitting: Urology

## 2022-06-28 ENCOUNTER — Ambulatory Visit: Payer: BLUE CROSS/BLUE SHIELD | Admitting: Cardiology

## 2022-07-09 ENCOUNTER — Telehealth: Payer: Self-pay | Admitting: *Deleted

## 2022-07-09 NOTE — Telephone Encounter (Signed)
CALLED PATIENT TO REMIND OF POST SEED APPTS. FOR 07-10-22, SPOKE WITH PATIENT AND HE IS AWARE OF THESE APPTS.

## 2022-07-09 NOTE — Progress Notes (Signed)
  Radiation Oncology         [phone]) [phone] ________________________________  Name: Jason Mccarty MRN: [id number]  Date: 07/10/2022  DOB: 1961-02-12  COMPLEX SIMULATION NOTE  NARRATIVE:  The patient was brought to the [facility] today following prostate seed implantation approximately one month ago.  Identity was confirmed.  All relevant records and images related to the planned course of therapy were reviewed.  Then, the patient was set-up supine.  CT images were obtained.  The CT images were loaded into the planning software.  Then the prostate and rectum were contoured.  Treatment planning then occurred.  The implanted iodine 125 seeds were identified by the physics staff for projection of radiation distribution  I have requested : 3D Simulation  I have requested a DVH of the following structures: Prostate and rectum.    ________________________________  Sheral Apley Tammi Klippel, M.D.

## 2022-07-09 NOTE — Progress Notes (Signed)
  Radiation Oncology         [phone] ________________________________  Name: Jason Mccarty MRN: [id number]  Date: 07/10/2022  DOB: September 22, 1960  SIMULATION AND TREATMENT PLANNING NOTE    ICD-10-CM   1. Malignant neoplasm of prostate ([place])  C61       DIAGNOSIS:  62 y.o. gentleman with stage T1c adenocarcinoma of the prostate with a Gleason's score of 4+4 and a PSA of 10   NARRATIVE:  The patient was brought to the [facility].  Identity was confirmed.  All relevant records and images related to the planned course of therapy were reviewed.  The patient freely provided informed written consent to proceed with treatment after reviewing the details related to the planned course of therapy. The consent form was witnessed and verified by the simulation staff.  Then, the patient was set-up in a stable reproducible supine position for radiation therapy.  A vacuum lock pillow device was custom fabricated to position his legs in a reproducible immobilized position.  Then, I performed a urethrogram under sterile conditions to identify the prostatic apex.  CT images were obtained.  Surface markings were placed.  The CT images were loaded into the planning software.  Then the prostate target and avoidance structures including the rectum, bladder, bowel and hips were contoured.  Treatment planning then occurred.  The radiation prescription was entered and confirmed.  A total of one complex treatment devices were fabricated. I have requested : Intensity Modulated Radiotherapy (IMRT) is medically necessary for this case for the following reason:  Rectal sparing.Marland Kitchen  PLAN:  The patient will receive 45 Gy in 25 fractions of 1.8 Gy, to supplement an up-front prostate seed implant boost of 110 Gy to achieve a total nominal dose of 155 Gy.  ________________________________  Sheral Apley Tammi Klippel, M.D.

## 2022-07-10 ENCOUNTER — Ambulatory Visit
Admission: RE | Admit: 2022-07-10 | Discharge: 2022-07-10 | Disposition: A | Discharge status: Home / Self Care | Payer: BLUE CROSS/BLUE SHIELD | Source: Ambulatory Visit | Attending: Urology | Admitting: Urology

## 2022-07-10 ENCOUNTER — Ambulatory Visit
Admission: RE | Admit: 2022-07-10 | Discharge: 2022-07-10 | Disposition: A | Discharge status: Home / Self Care | Payer: BLUE CROSS/BLUE SHIELD | Source: Ambulatory Visit | Attending: Radiation Oncology | Admitting: Radiation Oncology

## 2022-07-10 ENCOUNTER — Telehealth: Payer: Self-pay | Admitting: *Deleted

## 2022-07-10 ENCOUNTER — Encounter: Payer: Self-pay | Admitting: Urology

## 2022-07-10 VITALS — Ht 71.0 in

## 2022-07-10 DIAGNOSIS — C61 Malignant neoplasm of prostate: Secondary | ICD-10-CM | POA: Diagnosis not present

## 2022-07-10 NOTE — Progress Notes (Signed)
Post-seed nursing interview for a diagnosis of 63 y.o. 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  Patient identity verified. Patient reports doing well. No other issues conveyed at this time.  Meaningful use complete. I-PSS score- 5 - Mild SHIM score- 1- No sexual activity within 6 months. Urinary Management medication(s) Tamsulosin Urology appointment date- 09/2022 with Dr. Junious Silk at [facility]-  This concludes the interview.   Leandra Kern, LPN

## 2022-07-10 NOTE — Telephone Encounter (Signed)
CALLED PATIENT TO INFORM OF CHANGE TO SCHEDULE FOR TODAY, INFORMED PATIENT TO ARRIVE @ 10:15 AM ON 07-10-22, LVM FOR A MESSAGE

## 2022-07-12 ENCOUNTER — Encounter: Payer: Self-pay | Admitting: Radiation Oncology

## 2022-07-12 ENCOUNTER — Other Ambulatory Visit: Payer: Self-pay | Admitting: Student

## 2022-07-12 DIAGNOSIS — C61 Malignant neoplasm of prostate: Secondary | ICD-10-CM | POA: Diagnosis not present

## 2022-07-12 NOTE — Progress Notes (Signed)
  Radiation Oncology         [phone]) [phone] ________________________________  Name: [name] MRN: [id number]  Date: 07/12/2022  DOB: June 11, 1960  3D Planning Note   Prostate Brachytherapy Post-Implant Dosimetry  Diagnosis:  62 y.o. gentleman with stage T1c adenocarcinoma of the prostate with a Gleason's score of 4+4 and a PSA of 10   Narrative: On a previous date, Jason Mccarty returned following prostate seed implantation for post implant planning. He underwent CT scan complex simulation to delineate the three-dimensional structures of the pelvis and demonstrate the radiation distribution.  Since that time, the seed localization, and complex isodose planning with dose volume histograms have now been completed.  Results:   Prostate Coverage - The dose of radiation delivered to the 90% or more of the prostate gland (D90) was 114.49% of the prescription dose. This exceeds our goal of greater than 90%. Rectal Sparing - The volume of rectal tissue receiving the prescription dose or higher was 0.0 cc. This falls under our thresholds tolerance of 1.0 cc.  Impression: The prostate seed implant appears to show adequate target coverage and appropriate rectal sparing.  Plan:  The patient will continue to IMRT to the pelvis and then follow with urology for ongoing PSA determinations. I would anticipate a high likelihood for local tumor control with minimal risk for rectal morbidity.  ________________________________  Sheral Apley Tammi Klippel, M.D.

## 2022-07-18 DIAGNOSIS — C61 Malignant neoplasm of prostate: Secondary | ICD-10-CM | POA: Diagnosis present

## 2022-07-24 ENCOUNTER — Other Ambulatory Visit: Payer: Self-pay

## 2022-07-24 MED ORDER — METFORMIN HCL 500 MG PO TABS: 1000.0000 mg | ORAL_TABLET | Freq: Every day | ORAL | 1 refills | Status: DC

## 2022-07-29 ENCOUNTER — Other Ambulatory Visit: Payer: Self-pay

## 2022-07-29 ENCOUNTER — Ambulatory Visit
Admission: RE | Admit: 2022-07-29 | Discharge: 2022-07-29 | Disposition: A | Discharge status: Home / Self Care | Payer: BLUE CROSS/BLUE SHIELD | Source: Ambulatory Visit | Attending: Radiation Oncology | Admitting: Radiation Oncology

## 2022-07-29 DIAGNOSIS — C61 Malignant neoplasm of prostate: Secondary | ICD-10-CM | POA: Diagnosis not present

## 2022-07-29 LAB — RAD ONC ARIA SESSION SUMMARY
Course Elapsed Days: 0
Plan Fractions Treated to Date: 1
Plan Prescribed Dose Per Fraction: 1.8 Gy
Plan Total Fractions Prescribed: 25
Plan Total Prescribed Dose: 45 Gy
Reference Point Dosage Given to Date: 1.8 Gy
Reference Point Session Dosage Given: 1.8 Gy
Session Number: 1

## 2022-07-30 ENCOUNTER — Ambulatory Visit
Admission: RE | Admit: 2022-07-30 | Discharge: 2022-07-30 | Disposition: A | Discharge status: Home / Self Care | Payer: BLUE CROSS/BLUE SHIELD | Source: Ambulatory Visit | Attending: Radiation Oncology | Admitting: Radiation Oncology

## 2022-07-30 ENCOUNTER — Other Ambulatory Visit: Payer: Self-pay

## 2022-07-30 DIAGNOSIS — C61 Malignant neoplasm of prostate: Secondary | ICD-10-CM | POA: Diagnosis not present

## 2022-07-30 LAB — RAD ONC ARIA SESSION SUMMARY
Course Elapsed Days: 1
Plan Fractions Treated to Date: 2
Plan Prescribed Dose Per Fraction: 1.8 Gy
Plan Total Fractions Prescribed: 25
Plan Total Prescribed Dose: 45 Gy
Reference Point Dosage Given to Date: 3.6 Gy
Reference Point Session Dosage Given: 1.8 Gy
Session Number: 2

## 2022-07-31 ENCOUNTER — Other Ambulatory Visit: Payer: Self-pay

## 2022-07-31 ENCOUNTER — Ambulatory Visit
Admission: RE | Admit: 2022-07-31 | Discharge: 2022-07-31 | Disposition: A | Discharge status: Home / Self Care | Payer: BLUE CROSS/BLUE SHIELD | Source: Ambulatory Visit | Attending: Radiation Oncology | Admitting: Radiation Oncology

## 2022-07-31 DIAGNOSIS — C61 Malignant neoplasm of prostate: Secondary | ICD-10-CM | POA: Diagnosis not present

## 2022-07-31 LAB — RAD ONC ARIA SESSION SUMMARY
Course Elapsed Days: 2
Plan Fractions Treated to Date: 3
Plan Prescribed Dose Per Fraction: 1.8 Gy
Plan Total Fractions Prescribed: 25
Plan Total Prescribed Dose: 45 Gy
Reference Point Dosage Given to Date: 5.4 Gy
Reference Point Session Dosage Given: 1.8 Gy
Session Number: 3

## 2022-08-01 ENCOUNTER — Ambulatory Visit
Admission: RE | Admit: 2022-08-01 | Discharge: 2022-08-01 | Disposition: A | Discharge status: Home / Self Care | Payer: BLUE CROSS/BLUE SHIELD | Source: Ambulatory Visit | Attending: Radiation Oncology | Admitting: Radiation Oncology

## 2022-08-01 ENCOUNTER — Other Ambulatory Visit: Payer: Self-pay

## 2022-08-01 DIAGNOSIS — C61 Malignant neoplasm of prostate: Secondary | ICD-10-CM | POA: Diagnosis not present

## 2022-08-01 LAB — RAD ONC ARIA SESSION SUMMARY
Course Elapsed Days: 3
Plan Fractions Treated to Date: 4
Plan Prescribed Dose Per Fraction: 1.8 Gy
Plan Total Fractions Prescribed: 25
Plan Total Prescribed Dose: 45 Gy
Reference Point Dosage Given to Date: 7.2 Gy
Reference Point Session Dosage Given: 1.8 Gy
Session Number: 4

## 2022-08-02 ENCOUNTER — Other Ambulatory Visit: Payer: Self-pay

## 2022-08-02 ENCOUNTER — Ambulatory Visit
Admission: RE | Admit: 2022-08-02 | Discharge: 2022-08-02 | Disposition: A | Discharge status: Home / Self Care | Payer: BLUE CROSS/BLUE SHIELD | Source: Ambulatory Visit | Attending: Radiation Oncology | Admitting: Radiation Oncology

## 2022-08-02 DIAGNOSIS — C61 Malignant neoplasm of prostate: Secondary | ICD-10-CM | POA: Diagnosis not present

## 2022-08-02 LAB — RAD ONC ARIA SESSION SUMMARY
Course Elapsed Days: 4
Plan Fractions Treated to Date: 5
Plan Prescribed Dose Per Fraction: 1.8 Gy
Plan Total Fractions Prescribed: 25
Plan Total Prescribed Dose: 45 Gy
Reference Point Dosage Given to Date: 9 Gy
Reference Point Session Dosage Given: 1.8 Gy
Session Number: 5

## 2022-08-05 ENCOUNTER — Other Ambulatory Visit: Payer: Self-pay

## 2022-08-05 ENCOUNTER — Ambulatory Visit
Admission: RE | Admit: 2022-08-05 | Discharge: 2022-08-05 | Disposition: A | Discharge status: Home / Self Care | Payer: BLUE CROSS/BLUE SHIELD | Source: Ambulatory Visit | Attending: Radiation Oncology | Admitting: Radiation Oncology

## 2022-08-05 DIAGNOSIS — C61 Malignant neoplasm of prostate: Secondary | ICD-10-CM | POA: Diagnosis not present

## 2022-08-05 LAB — RAD ONC ARIA SESSION SUMMARY
Course Elapsed Days: 7
Plan Fractions Treated to Date: 6
Plan Prescribed Dose Per Fraction: 1.8 Gy
Plan Total Fractions Prescribed: 25
Plan Total Prescribed Dose: 45 Gy
Reference Point Dosage Given to Date: 10.8 Gy
Reference Point Session Dosage Given: 1.8 Gy
Session Number: 6

## 2022-08-06 ENCOUNTER — Ambulatory Visit
Admission: RE | Admit: 2022-08-06 | Discharge: 2022-08-06 | Disposition: A | Discharge status: Home / Self Care | Payer: BLUE CROSS/BLUE SHIELD | Source: Ambulatory Visit | Attending: Radiation Oncology | Admitting: Radiation Oncology

## 2022-08-06 ENCOUNTER — Other Ambulatory Visit: Payer: Self-pay

## 2022-08-06 DIAGNOSIS — C61 Malignant neoplasm of prostate: Secondary | ICD-10-CM | POA: Diagnosis not present

## 2022-08-06 LAB — RAD ONC ARIA SESSION SUMMARY
Course Elapsed Days: 8
Plan Fractions Treated to Date: 7
Plan Prescribed Dose Per Fraction: 1.8 Gy
Plan Total Fractions Prescribed: 25
Plan Total Prescribed Dose: 45 Gy
Reference Point Dosage Given to Date: 12.6 Gy
Reference Point Session Dosage Given: 1.8 Gy
Session Number: 7

## 2022-08-07 ENCOUNTER — Encounter: Payer: BLUE CROSS/BLUE SHIELD | Admitting: Student

## 2022-08-07 ENCOUNTER — Ambulatory Visit: Payer: BLUE CROSS/BLUE SHIELD

## 2022-08-08 ENCOUNTER — Ambulatory Visit: Payer: BLUE CROSS/BLUE SHIELD

## 2022-08-08 ENCOUNTER — Telehealth: Payer: Self-pay | Admitting: Radiation Oncology

## 2022-08-08 NOTE — Telephone Encounter (Signed)
Patient LVM stating he will not be able to come in for [facility] 4/25. Returned patient's call, no answer, LVM for a return call to confirm cancellation. L3 notified.

## 2022-08-09 ENCOUNTER — Ambulatory Visit
Admission: RE | Admit: 2022-08-09 | Discharge: 2022-08-09 | Disposition: A | Discharge status: Home / Self Care | Payer: BLUE CROSS/BLUE SHIELD | Source: Ambulatory Visit | Attending: Radiation Oncology | Admitting: Radiation Oncology

## 2022-08-09 ENCOUNTER — Other Ambulatory Visit: Payer: Self-pay

## 2022-08-09 DIAGNOSIS — C61 Malignant neoplasm of prostate: Secondary | ICD-10-CM | POA: Diagnosis not present

## 2022-08-09 LAB — RAD ONC ARIA SESSION SUMMARY
Course Elapsed Days: 11
Plan Fractions Treated to Date: 8
Plan Prescribed Dose Per Fraction: 1.8 Gy
Plan Total Fractions Prescribed: 25
Plan Total Prescribed Dose: 45 Gy
Reference Point Dosage Given to Date: 14.4 Gy
Reference Point Session Dosage Given: 1.8 Gy
Session Number: 8

## 2022-08-12 ENCOUNTER — Ambulatory Visit
Admission: RE | Admit: 2022-08-12 | Discharge: 2022-08-12 | Disposition: A | Discharge status: Home / Self Care | Payer: BLUE CROSS/BLUE SHIELD | Source: Ambulatory Visit | Attending: Radiation Oncology | Admitting: Radiation Oncology

## 2022-08-12 ENCOUNTER — Other Ambulatory Visit: Payer: Self-pay

## 2022-08-12 DIAGNOSIS — C61 Malignant neoplasm of prostate: Secondary | ICD-10-CM | POA: Diagnosis not present

## 2022-08-12 LAB — RAD ONC ARIA SESSION SUMMARY
Course Elapsed Days: 14
Plan Fractions Treated to Date: 9
Plan Prescribed Dose Per Fraction: 1.8 Gy
Plan Total Fractions Prescribed: 25
Plan Total Prescribed Dose: 45 Gy
Reference Point Dosage Given to Date: 16.2 Gy
Reference Point Session Dosage Given: 1.8 Gy
Session Number: 9

## 2022-08-13 ENCOUNTER — Other Ambulatory Visit: Payer: Self-pay

## 2022-08-13 ENCOUNTER — Ambulatory Visit
Admission: RE | Admit: 2022-08-13 | Discharge: 2022-08-13 | Disposition: A | Discharge status: Home / Self Care | Payer: BLUE CROSS/BLUE SHIELD | Source: Ambulatory Visit | Attending: Radiation Oncology | Admitting: Radiation Oncology

## 2022-08-13 DIAGNOSIS — C61 Malignant neoplasm of prostate: Secondary | ICD-10-CM | POA: Diagnosis not present

## 2022-08-13 LAB — RAD ONC ARIA SESSION SUMMARY
Course Elapsed Days: 15
Plan Fractions Treated to Date: 10
Plan Prescribed Dose Per Fraction: 1.8 Gy
Plan Total Fractions Prescribed: 25
Plan Total Prescribed Dose: 45 Gy
Reference Point Dosage Given to Date: 18 Gy
Reference Point Session Dosage Given: 1.8 Gy
Session Number: 10

## 2022-08-14 ENCOUNTER — Other Ambulatory Visit: Payer: Self-pay

## 2022-08-14 ENCOUNTER — Ambulatory Visit
Admission: RE | Admit: 2022-08-14 | Discharge: 2022-08-14 | Disposition: A | Discharge status: Home / Self Care | Payer: BLUE CROSS/BLUE SHIELD | Source: Ambulatory Visit | Attending: Radiation Oncology | Admitting: Radiation Oncology

## 2022-08-14 DIAGNOSIS — C61 Malignant neoplasm of prostate: Secondary | ICD-10-CM | POA: Diagnosis present

## 2022-08-14 LAB — RAD ONC ARIA SESSION SUMMARY
Course Elapsed Days: 16
Plan Fractions Treated to Date: 11
Plan Prescribed Dose Per Fraction: 1.8 Gy
Plan Total Fractions Prescribed: 25
Plan Total Prescribed Dose: 45 Gy
Reference Point Dosage Given to Date: 19.8 Gy
Reference Point Session Dosage Given: 1.8 Gy
Session Number: 11

## 2022-08-15 ENCOUNTER — Other Ambulatory Visit: Payer: Self-pay

## 2022-08-15 ENCOUNTER — Encounter: Payer: Self-pay | Admitting: Student

## 2022-08-15 ENCOUNTER — Ambulatory Visit (INDEPENDENT_AMBULATORY_CARE_PROVIDER_SITE_OTHER): Payer: BLUE CROSS/BLUE SHIELD | Admitting: Student

## 2022-08-15 ENCOUNTER — Ambulatory Visit
Admission: RE | Admit: 2022-08-15 | Discharge: 2022-08-15 | Disposition: A | Discharge status: Home / Self Care | Payer: BLUE CROSS/BLUE SHIELD | Source: Ambulatory Visit | Attending: Radiation Oncology | Admitting: Radiation Oncology

## 2022-08-15 VITALS — BP 106/60 | HR 74 | Temp 97.7°F | Ht 72.0 in | Wt 297.1 lb

## 2022-08-15 DIAGNOSIS — I130 Hypertensive heart and chronic kidney disease with heart failure and stage 1 through stage 4 chronic kidney disease, or unspecified chronic kidney disease: Secondary | ICD-10-CM | POA: Diagnosis not present

## 2022-08-15 DIAGNOSIS — F17210 Nicotine dependence, cigarettes, uncomplicated: Secondary | ICD-10-CM

## 2022-08-15 DIAGNOSIS — I5022 Chronic systolic (congestive) heart failure: Secondary | ICD-10-CM

## 2022-08-15 DIAGNOSIS — Z7984 Long term (current) use of oral hypoglycemic drugs: Secondary | ICD-10-CM

## 2022-08-15 DIAGNOSIS — N1831 Chronic kidney disease, stage 3a: Secondary | ICD-10-CM

## 2022-08-15 DIAGNOSIS — I10 Essential (primary) hypertension: Secondary | ICD-10-CM

## 2022-08-15 DIAGNOSIS — C61 Malignant neoplasm of prostate: Secondary | ICD-10-CM

## 2022-08-15 DIAGNOSIS — E1122 Type 2 diabetes mellitus with diabetic chronic kidney disease: Secondary | ICD-10-CM

## 2022-08-15 DIAGNOSIS — Z1211 Encounter for screening for malignant neoplasm of colon: Secondary | ICD-10-CM

## 2022-08-15 LAB — RAD ONC ARIA SESSION SUMMARY
Course Elapsed Days: 17
Plan Fractions Treated to Date: 12
Plan Prescribed Dose Per Fraction: 1.8 Gy
Plan Total Fractions Prescribed: 25
Plan Total Prescribed Dose: 45 Gy
Reference Point Dosage Given to Date: 21.6 Gy
Reference Point Session Dosage Given: 1.8 Gy
Session Number: 12

## 2022-08-15 LAB — POCT GLYCOSYLATED HEMOGLOBIN (HGB A1C): Hemoglobin A1C: 7.2 % — AB (ref 4.0–5.6)

## 2022-08-15 LAB — HM DIABETES EYE EXAM

## 2022-08-15 LAB — GLUCOSE, CAPILLARY: Glucose-Capillary: 174 mg/dL — ABNORMAL HIGH (ref 70–99)

## 2022-08-15 MED ORDER — SOLIFENACIN SUCCINATE 5 MG PO TABS: 5.0000 mg | ORAL_TABLET | Freq: Every day | ORAL | 1 refills | Status: AC

## 2022-08-15 MED ORDER — AMLODIPINE BESYLATE 5 MG PO TABS: 5.0000 mg | ORAL_TABLET | Freq: Every day | ORAL | 1 refills | Status: DC

## 2022-08-15 NOTE — Assessment & Plan Note (Signed)
Baseline kidney function consistent with Stage IIIA, likely from underlying diabetes and HTN. Had worsening kidney function noted during hospital visit about 1 month ago on I-Stat chem 8. Will recheck BMP today to ensure has returned to previous baseline of around 1.4-1.6.   -f/u BMP

## 2022-08-15 NOTE — Addendum Note (Signed)
Addended by: Baird Cancer on: 08/15/2022 01:36 PM   Modules accepted: Orders

## 2022-08-15 NOTE — Assessment & Plan Note (Addendum)
Living with T2DM c/b CKD IIIA. He is currently on metformin and farxiga. His A1c was 8.1% about 3 months ago. Repeat A1c today of 7.2%. He has lost some weight due to lifestyle modifications and from radiation therapy for prostate cancer which is helping. I will check BMP today to ensure kidney function and electrolytes are stable. If so, will increase farxiga to 10mg  daily for more optimal glycemic control (goal <7%).   Plan: -continue metformin and farxiga -if BMP stable, will increase farxiga to 10mg  daily -f/u in 3 months for repeat A1c -received retinal exam with Lupita Leash today  ADDENDUM: Patient forgot to stop at lab for BMP. Discussed with him, he would like to wait until next visit in 2 months. Will check BMP at that time and if stable, can increase farxiga dose at that time.

## 2022-08-15 NOTE — Assessment & Plan Note (Signed)
Patient living with HTN, currently on coreg 6.25mg  BID, farxiga 5mg  daily, spironolactone 25mg  daily, entresto 24-26mg  BID, norvasc 10mg  daily, and lasix 20mg  daily. BP is 106/60 in clinic today. He is asymptomatic and without orthostatic symptoms. He has lost some weight due to lifestyle changes and current radiation therapy. Given HF, I will avoid changing GDMT. However, I think that his dose of norvasc can be reduced.   Plan: -continue coreg, farxiga, spironolactone, entresto, lasix -reduce norvasc to 5mg  daily -f/u in 2 months, if BP remains low-normal > stop norvasc -if labs are stable, will plan to increase farxiga to 10mg  daily for more optimal glycemic control

## 2022-08-15 NOTE — Patient Instructions (Signed)
Jason Mccarty,  It was a pleasure seeing you in the clinic today.   Your A1c is a lot better, keep up the good work! I am decreasing your amlodipine dose to 5mg  daily. Please cut your amlodipine pills in half for now but I have sent the reduced dose to your pharmacy in case you need more. We are checking your kidney function and electrolytes today. I will call you with the results. If all looks well, we will plan to increase your farxiga dose so that we can get your A1c below 7%. You got your eye exam with Lupita Leash today. Please come back in 2 months for your next visit.  Please call our clinic at [phone] if you have any questions or concerns. The best time to call is Monday-Friday from 9am-4pm, but there is someone available 24/7 at the same number. If you need medication refills, please notify your pharmacy one week in advance and they will send [place] a request.   Thank you for letting us take part in your care. We look forward to seeing you next time!

## 2022-08-15 NOTE — Assessment & Plan Note (Signed)
Patient living with chronic HFrEF. EF was previously 40-45% with recent ECHO showing improvement to 50-55% on GDMT. Current GDMT includes carvedilol, farxiga, spiro, entresto. He is taking lasix 20mg  daily as well. He does follow cardiology. Has 1+ pitting edema in bilateral lower extremities up to mid-shins, which he reports is due to not taking lasix for the past few days. Have advised him to restart given edema. He does not need any medication refills at this time. Will plan to increase farxiga dose should his labs remain stable.   Plan: -continue current regimen -planning to increase farxiga if labs remain stable

## 2022-08-15 NOTE — Progress Notes (Signed)
   CC: f/u T2DM, HTN, HFrEF  HPI:  [name] is a 62 y.o. male with history listed below presenting to the [facility] for f/u T2DM, HTN, HFrEF. Please see individualized problem based charting for full HPI.  Past Medical History:  Diagnosis Date   Bilateral lower extremity edema    BPH associated with nocturia    CKD (chronic kidney disease), stage III (HCC)    Heart failure with reduced ejection fraction (HCC) 04/2021   cardiologist--- dr Servando Salina   History of acute respiratory failure 05/11/2021   admission in epic;  in setting HTN urgency bp 300s w/ ARF w/ hypercapnia (sts 70s) w/ heart failure AKI/ new dx DM2/  hepatocellular injury   Ischemic cardiomyopathy 04/2021   per echo 01/ 2023 ef 40-45%;  echo 05-15-2022 50-55%   Malignant neoplasm prostate (HCC) 01/2022   urologist-  dr eskridge/  radiation oncologist--- dr Kathrynn Running:  dx 10/ 2023, gleason 4+4   Obesity, Class III, BMI 40-49.9 (morbid obesity) (HCC)    OSA on CPAP    per pt uses nightly   Severe hypertension    followed by cardiology and pcp   Type 2 diabetes mellitus (HCC) 04/2021   followed by pcp   Wears glasses    Wears partial dentures    1990s    Review of Systems:  Negative aside from that listed in individualized problem based charting.  Physical Exam:  Vitals:   08/15/22 0941  BP: 106/60  Pulse: 74  Temp: 97.7 F (36.5 C)  TempSrc: Oral  SpO2: 98%  Weight: 297 lb 1.6 oz (134.8 kg)  Height: 6' (1.829 m)   Physical Exam Constitutional:      Appearance: Normal appearance. He is obese. He is not ill-appearing.  HENT:     Mouth/Throat:     Mouth: Mucous membranes are moist.     Pharynx: Oropharynx is clear. No oropharyngeal exudate.  Eyes:     General: No scleral icterus.    Extraocular Movements: Extraocular movements intact.     Conjunctiva/sclera: Conjunctivae normal.     Pupils: Pupils are equal, round, and reactive to light.  Cardiovascular:     Rate and Rhythm: Normal rate and regular  rhythm.     Heart sounds: Normal heart sounds. No murmur heard.    No friction rub. No gallop.  Pulmonary:     Effort: Pulmonary effort is normal.     Breath sounds: Normal breath sounds. No wheezing, rhonchi or rales.  Abdominal:     General: Bowel sounds are normal. There is no distension.     Palpations: Abdomen is soft.     Tenderness: There is no abdominal tenderness. There is no guarding or rebound.  Musculoskeletal:        General: Normal range of motion.     Comments: 1+ pitting edema in bilateral lower extremities up to mid-shins.  Skin:    General: Skin is warm and dry.  Neurological:     General: No focal deficit present.     Mental Status: He is alert and oriented to person, place, and time.  Psychiatric:        Mood and Affect: Mood normal.        Behavior: Behavior normal.      Assessment & Plan:   See Encounters Tab for problem based charting.  Patient discussed with Dr.  Lafonda Mosses

## 2022-08-15 NOTE — Progress Notes (Signed)
Retinal images were done today as part of this patient's yearly diabetes health maintenance program. They were transmitted electronically to an ophthalmologist who will interpret them and send a report back with their findings. This was explained to the patient and they were also informed that the results would be communicated to them either by phone,  mail or both.  Norm Parcel, RD 08/15/2022 1:34 PM.

## 2022-08-15 NOTE — Assessment & Plan Note (Signed)
Refusing colonoscopy referral today until his radiation therapy has been completed. Will discuss this with him again at next visit in 2 months after radiation has been completed.

## 2022-08-15 NOTE — Progress Notes (Signed)
Internal Medicine Clinic Attending  Case discussed with Dr. Jinwala  At the time of the visit.  We reviewed the resident's history and exam and pertinent patient test results.  I agree with the assessment, diagnosis, and plan of care documented in the resident's note.  

## 2022-08-15 NOTE — Assessment & Plan Note (Signed)
He is undergoing radioactive seed therapy with Dr. Kathrynn Running. Tolerating this well although does note fatigue with treatment. He reports having about 3 more weeks of treatment remaining.

## 2022-08-15 NOTE — Addendum Note (Signed)
Addended by: Merrilyn Puma on: 08/15/2022 01:43 PM   Modules accepted: Orders

## 2022-08-16 ENCOUNTER — Encounter: Payer: BLUE CROSS/BLUE SHIELD | Admitting: Student

## 2022-08-16 ENCOUNTER — Other Ambulatory Visit: Payer: Self-pay

## 2022-08-16 ENCOUNTER — Ambulatory Visit
Admission: RE | Admit: 2022-08-16 | Discharge: 2022-08-16 | Disposition: A | Discharge status: Home / Self Care | Payer: BLUE CROSS/BLUE SHIELD | Source: Ambulatory Visit | Attending: Radiation Oncology | Admitting: Radiation Oncology

## 2022-08-16 DIAGNOSIS — C61 Malignant neoplasm of prostate: Secondary | ICD-10-CM | POA: Diagnosis not present

## 2022-08-16 LAB — RAD ONC ARIA SESSION SUMMARY
Course Elapsed Days: 18
Plan Fractions Treated to Date: 13
Plan Prescribed Dose Per Fraction: 1.8 Gy
Plan Total Fractions Prescribed: 25
Plan Total Prescribed Dose: 45 Gy
Reference Point Dosage Given to Date: 23.4 Gy
Reference Point Session Dosage Given: 1.8 Gy
Session Number: 13

## 2022-08-19 ENCOUNTER — Ambulatory Visit: Payer: BLUE CROSS/BLUE SHIELD

## 2022-08-20 ENCOUNTER — Encounter: Payer: Self-pay | Admitting: Dietician

## 2022-08-20 ENCOUNTER — Ambulatory Visit
Admission: RE | Admit: 2022-08-20 | Discharge: 2022-08-20 | Disposition: A | Discharge status: Home / Self Care | Payer: BLUE CROSS/BLUE SHIELD | Source: Ambulatory Visit | Attending: Radiation Oncology | Admitting: Radiation Oncology

## 2022-08-20 ENCOUNTER — Other Ambulatory Visit: Payer: Self-pay

## 2022-08-20 DIAGNOSIS — C61 Malignant neoplasm of prostate: Secondary | ICD-10-CM | POA: Diagnosis not present

## 2022-08-20 LAB — RAD ONC ARIA SESSION SUMMARY
Course Elapsed Days: 22
Plan Fractions Treated to Date: 14
Plan Prescribed Dose Per Fraction: 1.8 Gy
Plan Total Fractions Prescribed: 25
Plan Total Prescribed Dose: 45 Gy
Reference Point Dosage Given to Date: 25.2 Gy
Reference Point Session Dosage Given: 1.8 Gy
Session Number: 14

## 2022-08-21 ENCOUNTER — Ambulatory Visit
Admission: RE | Admit: 2022-08-21 | Discharge: 2022-08-21 | Disposition: A | Discharge status: Home / Self Care | Payer: BLUE CROSS/BLUE SHIELD | Source: Ambulatory Visit | Attending: Radiation Oncology | Admitting: Radiation Oncology

## 2022-08-21 ENCOUNTER — Other Ambulatory Visit: Payer: Self-pay

## 2022-08-21 DIAGNOSIS — C61 Malignant neoplasm of prostate: Secondary | ICD-10-CM | POA: Diagnosis not present

## 2022-08-21 LAB — RAD ONC ARIA SESSION SUMMARY
Course Elapsed Days: 23
Plan Fractions Treated to Date: 15
Plan Prescribed Dose Per Fraction: 1.8 Gy
Plan Total Fractions Prescribed: 25
Plan Total Prescribed Dose: 45 Gy
Reference Point Dosage Given to Date: 27 Gy
Reference Point Session Dosage Given: 1.8 Gy
Session Number: 15

## 2022-08-22 ENCOUNTER — Other Ambulatory Visit: Payer: Self-pay

## 2022-08-22 ENCOUNTER — Ambulatory Visit
Admission: RE | Admit: 2022-08-22 | Discharge: 2022-08-22 | Disposition: A | Discharge status: Home / Self Care | Payer: BLUE CROSS/BLUE SHIELD | Source: Ambulatory Visit | Attending: Radiation Oncology | Admitting: Radiation Oncology

## 2022-08-22 DIAGNOSIS — C61 Malignant neoplasm of prostate: Secondary | ICD-10-CM | POA: Diagnosis not present

## 2022-08-22 LAB — RAD ONC ARIA SESSION SUMMARY
Course Elapsed Days: 24
Plan Fractions Treated to Date: 16
Plan Prescribed Dose Per Fraction: 1.8 Gy
Plan Total Fractions Prescribed: 25
Plan Total Prescribed Dose: 45 Gy
Reference Point Dosage Given to Date: 28.8 Gy
Reference Point Session Dosage Given: 1.8 Gy
Session Number: 16

## 2022-08-23 ENCOUNTER — Other Ambulatory Visit: Payer: Self-pay

## 2022-08-23 ENCOUNTER — Ambulatory Visit
Admission: RE | Admit: 2022-08-23 | Discharge: 2022-08-23 | Disposition: A | Discharge status: Home / Self Care | Payer: BLUE CROSS/BLUE SHIELD | Source: Ambulatory Visit | Attending: Radiation Oncology | Admitting: Radiation Oncology

## 2022-08-23 DIAGNOSIS — C61 Malignant neoplasm of prostate: Secondary | ICD-10-CM | POA: Diagnosis not present

## 2022-08-23 LAB — RAD ONC ARIA SESSION SUMMARY
Course Elapsed Days: 25
Plan Fractions Treated to Date: 17
Plan Prescribed Dose Per Fraction: 1.8 Gy
Plan Total Fractions Prescribed: 25
Plan Total Prescribed Dose: 45 Gy
Reference Point Dosage Given to Date: 30.6 Gy
Reference Point Session Dosage Given: 1.8 Gy
Session Number: 17

## 2022-08-26 ENCOUNTER — Ambulatory Visit
Admission: RE | Admit: 2022-08-26 | Discharge: 2022-08-26 | Disposition: A | Discharge status: Home / Self Care | Payer: BLUE CROSS/BLUE SHIELD | Source: Ambulatory Visit | Attending: Radiation Oncology | Admitting: Radiation Oncology

## 2022-08-26 ENCOUNTER — Other Ambulatory Visit: Payer: Self-pay

## 2022-08-26 DIAGNOSIS — C61 Malignant neoplasm of prostate: Secondary | ICD-10-CM | POA: Diagnosis not present

## 2022-08-26 LAB — RAD ONC ARIA SESSION SUMMARY
Course Elapsed Days: 28
Plan Fractions Treated to Date: 18
Plan Prescribed Dose Per Fraction: 1.8 Gy
Plan Total Fractions Prescribed: 25
Plan Total Prescribed Dose: 45 Gy
Reference Point Dosage Given to Date: 32.4 Gy
Reference Point Session Dosage Given: 1.8 Gy
Session Number: 18

## 2022-08-26 IMAGING — DX DG CHEST 1V PORT
1 series · 1 of 1 positions shown · non-contrast
Comparison: No priors.

CLINICAL DATA: 60-year-old male with history of sudden onset of
shortness of breath.

EXAM:
PORTABLE CHEST 1 VIEW

[chest ap]
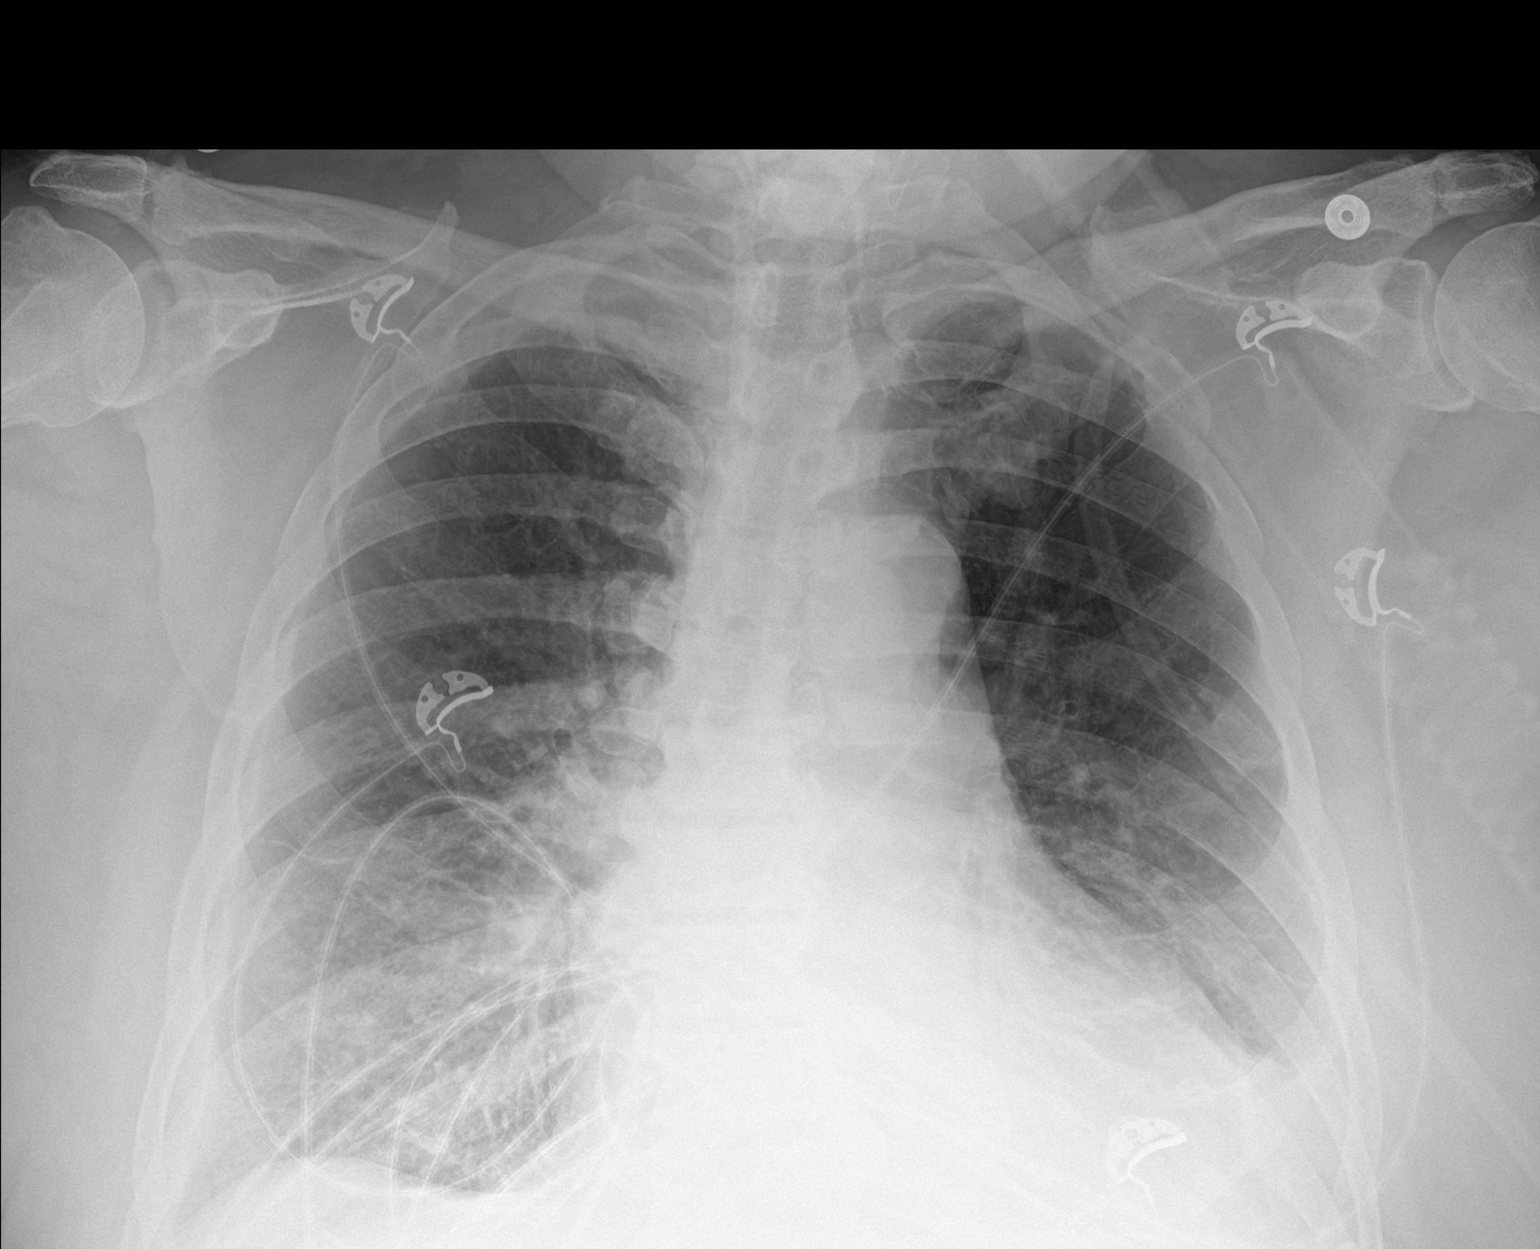

[1 of 1 positions shown; findings below may reference images not displayed]

FINDINGS: Ill-defined opacities and areas of interstitial prominence
throughout the mid to lower lungs bilaterally. More dense opacity in
the medial aspect of the left lung base, favored to reflect
subsegmental atelectasis. Small left pleural effusion. No
pneumothorax. No evidence of pulmonary edema. Heart size is normal.
The patient is rotated to the left on today's exam, resulting in
distortion of the mediastinal contours and reduced diagnostic
sensitivity and specificity for mediastinal pathology.
IMPRESSION: 1. Ill-defined opacities and areas of interstitial prominence in the
mid to lower lungs bilaterally, which could be seen in the setting
of acute viral infection.
2. Small left-sided pleural effusion with probable subsegmental
atelectasis in the left lower lobe, although focal area of airspace
consolidation is not excluded.

1.

## 2022-08-26 IMAGING — US US RENAL
1 series · 14 of 25 positions shown · non-contrast
Comparison: None.

CLINICAL DATA: Acute renal insufficiency

EXAM:
RENAL / URINARY TRACT ULTRASOUND COMPLETE

[Series 1: us renal · 14 of 32 slices shown]
[im 1/32]
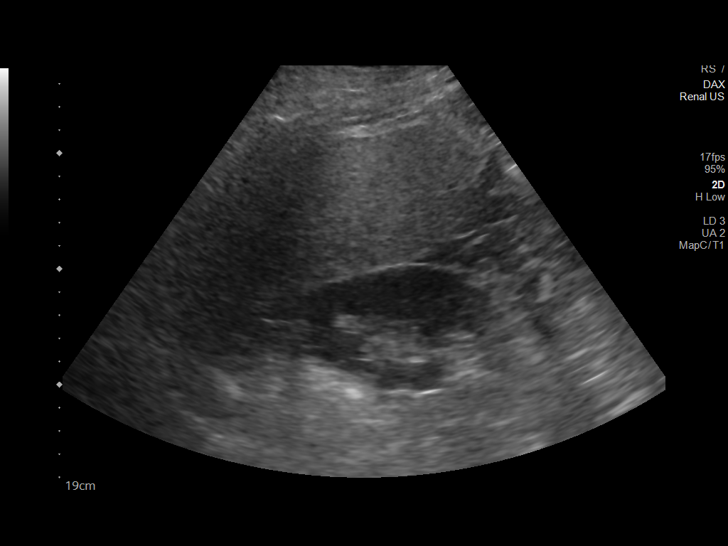
[im 3/32]
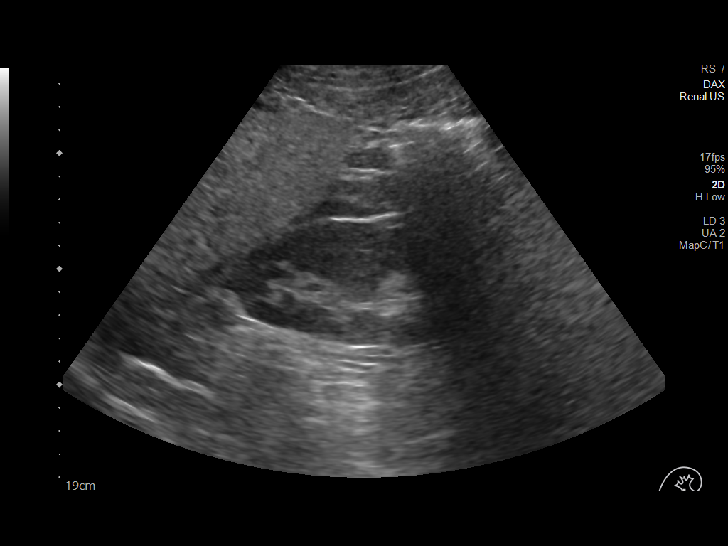
[im 6/32]
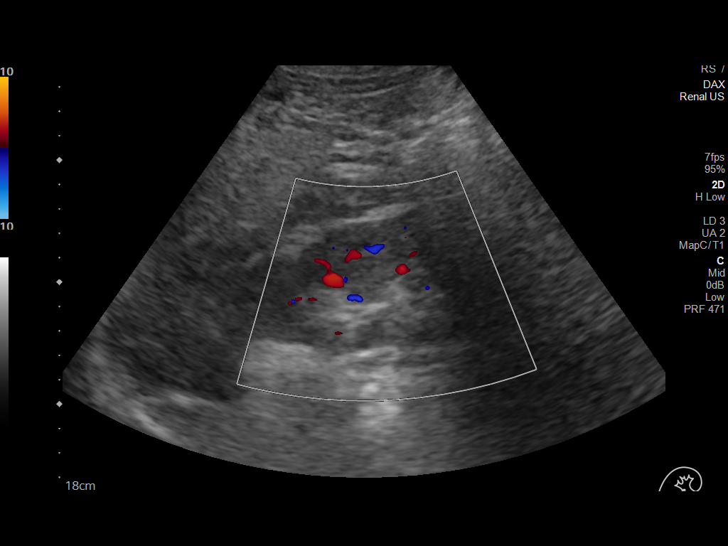
[im 8/32]
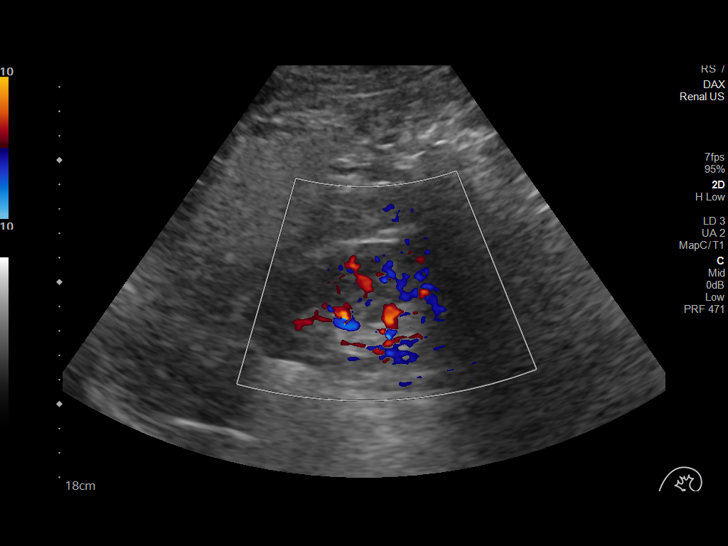
[im 11/32]
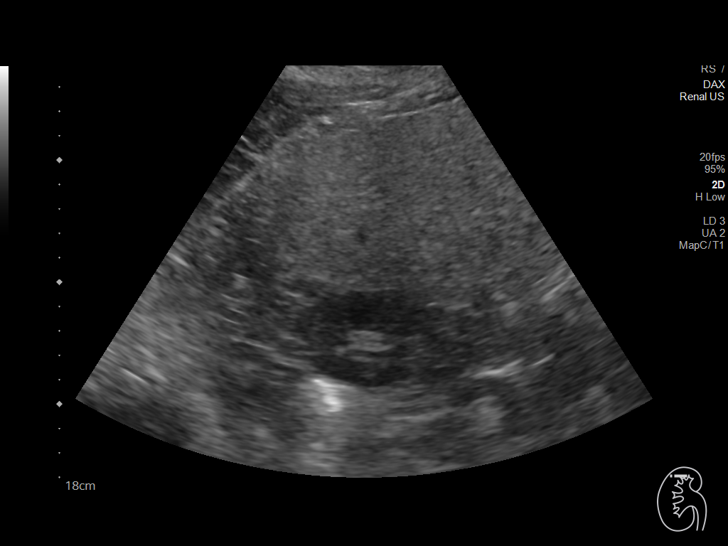
[im 12/32]
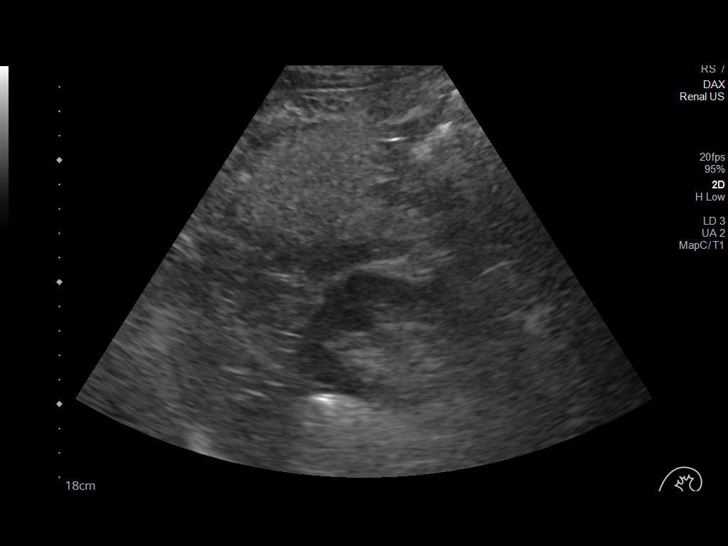
[im 15/32]
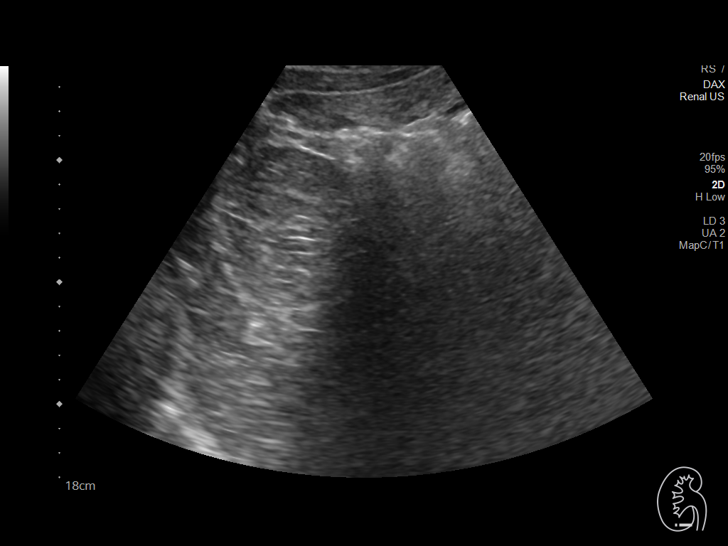
[im 17/32]
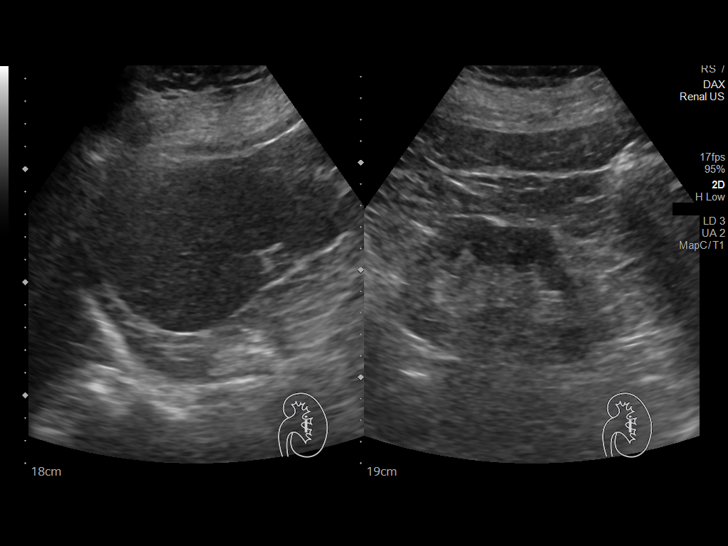
[im 20/32]
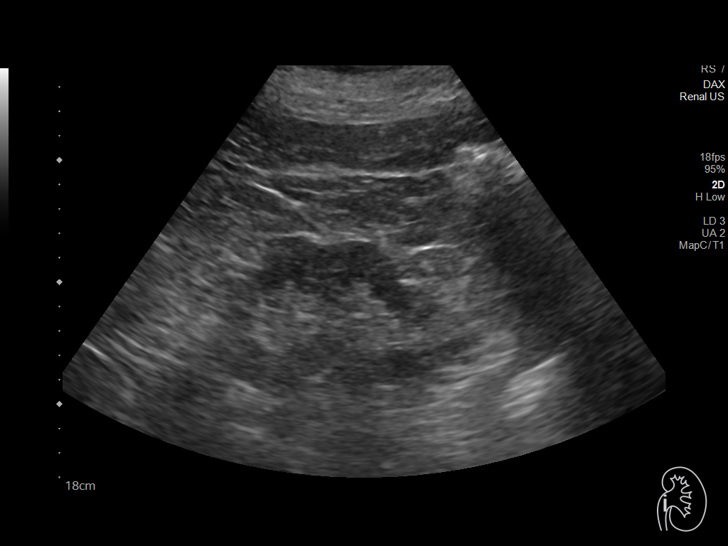
[im 21/32]
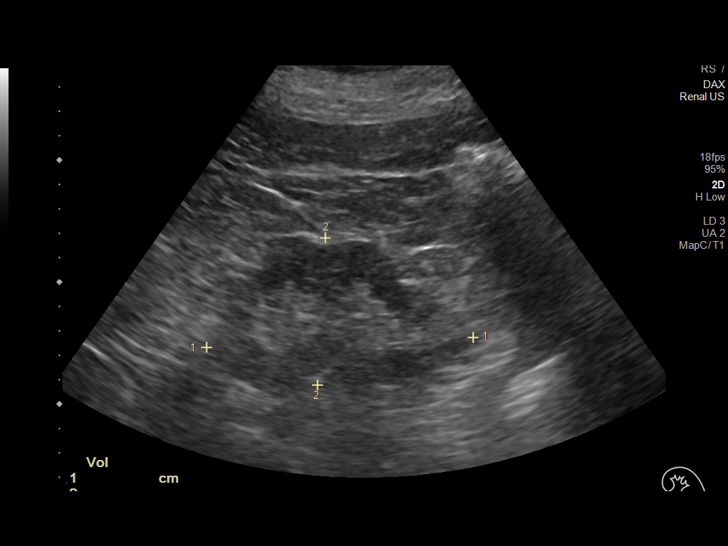
[im 24/32]
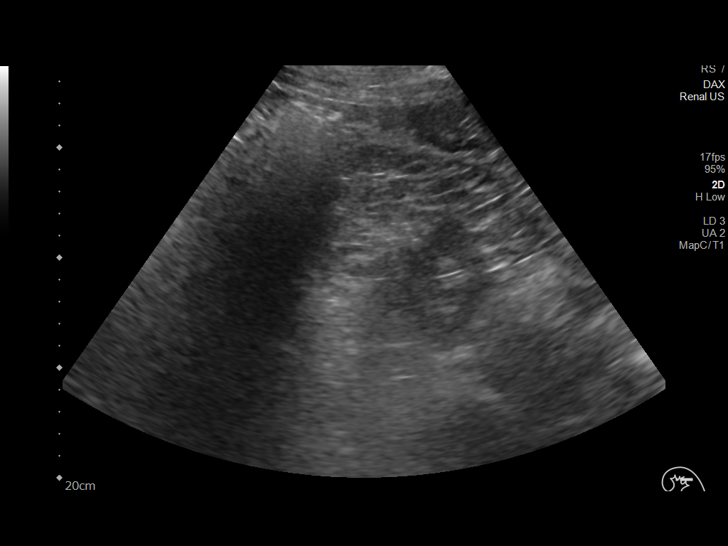
[im 26/32]
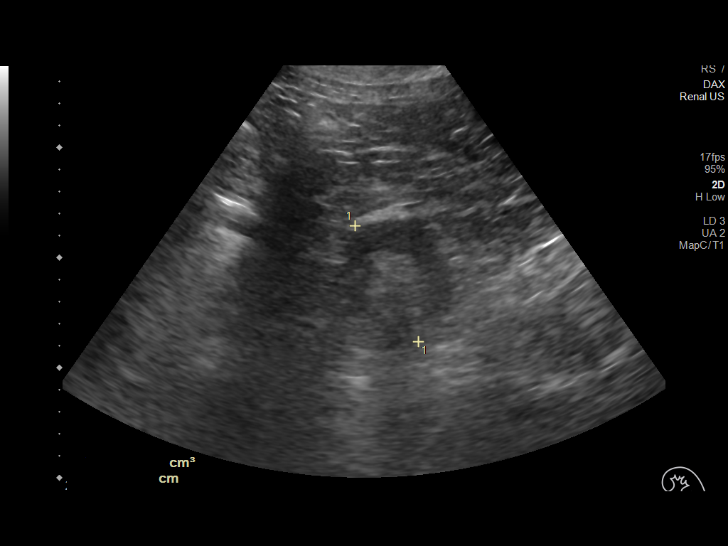
[im 29/32]
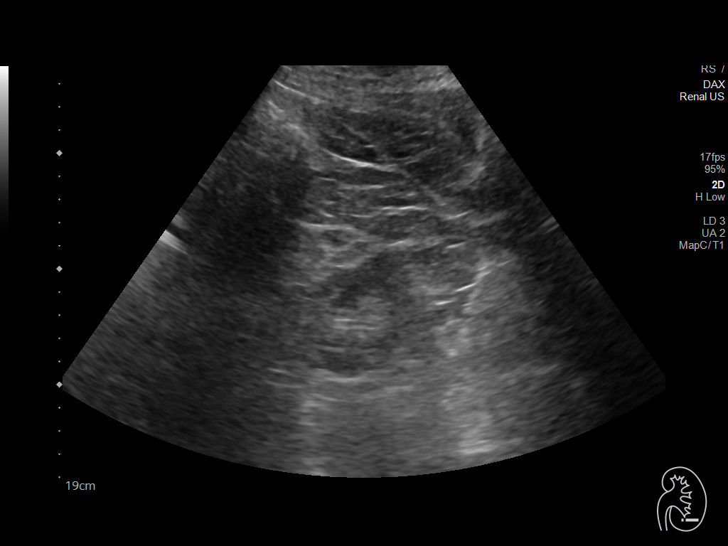
[im 32/32]
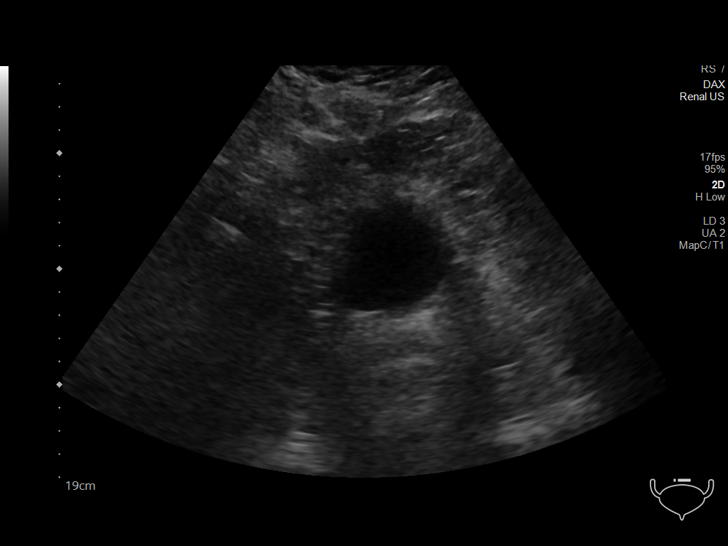

[14 of 25 positions shown; findings below may reference images not displayed]

FINDINGS: Right Kidney:

Renal measurements: 10.8 x 6.2 x 6.7 cm = volume: 216 mL. The lower
pole is not well visualized due to overlying bowel gas. Echogenicity
within normal limits. No mass or hydronephrosis visualized.

Left Kidney:

Renal measurements: 10.9 x 6.0 x 6.0 cm = volume: 108 mL.
Echogenicity within normal limits. No mass or hydronephrosis
visualized.

Bladder:

Appears normal for degree of bladder distention although only mild
distention limits evaluation.

Other:

None.
IMPRESSION: Normal appearance of the bilateral kidneys.

## 2022-08-27 ENCOUNTER — Ambulatory Visit
Admission: RE | Admit: 2022-08-27 | Discharge: 2022-08-27 | Disposition: A | Discharge status: Home / Self Care | Payer: BLUE CROSS/BLUE SHIELD | Source: Ambulatory Visit | Attending: Radiation Oncology | Admitting: Radiation Oncology

## 2022-08-27 ENCOUNTER — Other Ambulatory Visit: Payer: Self-pay

## 2022-08-27 DIAGNOSIS — C61 Malignant neoplasm of prostate: Secondary | ICD-10-CM | POA: Diagnosis not present

## 2022-08-27 LAB — RAD ONC ARIA SESSION SUMMARY
Course Elapsed Days: 29
Plan Fractions Treated to Date: 19
Plan Prescribed Dose Per Fraction: 1.8 Gy
Plan Total Fractions Prescribed: 25
Plan Total Prescribed Dose: 45 Gy
Reference Point Dosage Given to Date: 34.2 Gy
Reference Point Session Dosage Given: 1.8 Gy
Session Number: 19

## 2022-08-28 ENCOUNTER — Other Ambulatory Visit: Payer: Self-pay

## 2022-08-28 ENCOUNTER — Ambulatory Visit
Admission: RE | Admit: 2022-08-28 | Discharge: 2022-08-28 | Disposition: A | Discharge status: Home / Self Care | Payer: BLUE CROSS/BLUE SHIELD | Source: Ambulatory Visit | Attending: Radiation Oncology | Admitting: Radiation Oncology

## 2022-08-28 DIAGNOSIS — C61 Malignant neoplasm of prostate: Secondary | ICD-10-CM | POA: Diagnosis not present

## 2022-08-28 LAB — RAD ONC ARIA SESSION SUMMARY
Course Elapsed Days: 30
Plan Fractions Treated to Date: 20
Plan Prescribed Dose Per Fraction: 1.8 Gy
Plan Total Fractions Prescribed: 25
Plan Total Prescribed Dose: 45 Gy
Reference Point Dosage Given to Date: 36 Gy
Reference Point Session Dosage Given: 1.8 Gy
Session Number: 20

## 2022-08-29 ENCOUNTER — Other Ambulatory Visit: Payer: Self-pay

## 2022-08-29 ENCOUNTER — Ambulatory Visit
Admission: RE | Admit: 2022-08-29 | Discharge: 2022-08-29 | Disposition: A | Discharge status: Home / Self Care | Payer: BLUE CROSS/BLUE SHIELD | Source: Ambulatory Visit | Attending: Radiation Oncology | Admitting: Radiation Oncology

## 2022-08-29 DIAGNOSIS — C61 Malignant neoplasm of prostate: Secondary | ICD-10-CM | POA: Diagnosis not present

## 2022-08-29 LAB — RAD ONC ARIA SESSION SUMMARY
Course Elapsed Days: 31
Plan Fractions Treated to Date: 21
Plan Prescribed Dose Per Fraction: 1.8 Gy
Plan Total Fractions Prescribed: 25
Plan Total Prescribed Dose: 45 Gy
Reference Point Dosage Given to Date: 37.8 Gy
Reference Point Session Dosage Given: 1.8 Gy
Session Number: 21

## 2022-08-30 ENCOUNTER — Ambulatory Visit: Payer: BLUE CROSS/BLUE SHIELD

## 2022-08-30 ENCOUNTER — Ambulatory Visit
Admission: RE | Admit: 2022-08-30 | Discharge: 2022-08-30 | Disposition: A | Discharge status: Home / Self Care | Payer: BLUE CROSS/BLUE SHIELD | Source: Ambulatory Visit | Attending: Radiation Oncology | Admitting: Radiation Oncology

## 2022-08-30 ENCOUNTER — Other Ambulatory Visit: Payer: Self-pay

## 2022-08-30 DIAGNOSIS — N1831 Chronic kidney disease, stage 3a: Secondary | ICD-10-CM

## 2022-08-30 DIAGNOSIS — C61 Malignant neoplasm of prostate: Secondary | ICD-10-CM | POA: Diagnosis not present

## 2022-08-30 LAB — RAD ONC ARIA SESSION SUMMARY
Course Elapsed Days: 32
Plan Fractions Treated to Date: 22
Plan Prescribed Dose Per Fraction: 1.8 Gy
Plan Total Fractions Prescribed: 25
Plan Total Prescribed Dose: 45 Gy
Reference Point Dosage Given to Date: 39.6 Gy
Reference Point Session Dosage Given: 1.8 Gy
Session Number: 22

## 2022-09-02 ENCOUNTER — Other Ambulatory Visit: Payer: Self-pay

## 2022-09-02 ENCOUNTER — Ambulatory Visit: Payer: BLUE CROSS/BLUE SHIELD

## 2022-09-02 ENCOUNTER — Ambulatory Visit
Admission: RE | Admit: 2022-09-02 | Discharge: 2022-09-02 | Disposition: A | Discharge status: Home / Self Care | Payer: BLUE CROSS/BLUE SHIELD | Source: Ambulatory Visit | Attending: Radiation Oncology | Admitting: Radiation Oncology

## 2022-09-02 DIAGNOSIS — C61 Malignant neoplasm of prostate: Secondary | ICD-10-CM | POA: Diagnosis not present

## 2022-09-02 LAB — RAD ONC ARIA SESSION SUMMARY
Course Elapsed Days: 35
Plan Fractions Treated to Date: 23
Plan Prescribed Dose Per Fraction: 1.8 Gy
Plan Total Fractions Prescribed: 25
Plan Total Prescribed Dose: 45 Gy
Reference Point Dosage Given to Date: 41.4 Gy
Reference Point Session Dosage Given: 1.8 Gy
Session Number: 23

## 2022-09-02 MED ORDER — DAPAGLIFLOZIN PROPANEDIOL 5 MG PO TABS: 5.0000 mg | ORAL_TABLET | Freq: Every day | ORAL | 2 refills | Status: DC

## 2022-09-03 ENCOUNTER — Telehealth: Payer: Self-pay

## 2022-09-03 ENCOUNTER — Ambulatory Visit: Payer: BLUE CROSS/BLUE SHIELD

## 2022-09-03 ENCOUNTER — Telehealth: Payer: Self-pay | Admitting: Radiation Oncology

## 2022-09-03 NOTE — Telephone Encounter (Signed)
RN called patient to speak with him about side effects and treatment options had to leave a voice message to call back.

## 2022-09-03 NOTE — Telephone Encounter (Signed)
5/21 @ 8:50 am patient called to cancel his xrt appt today due to having diarrhea.  He stated will come in tomorrow.  Called L4 machine spoke to [place], so they are aware.  Also patient wanted to find out if ok for his 53yr granddaughter to be around him while he having his radiation treatments.  Secure chat sent to [facility], RN/Ashlyn Bruning so they are aware.

## 2022-09-04 ENCOUNTER — Other Ambulatory Visit: Payer: Self-pay

## 2022-09-04 ENCOUNTER — Ambulatory Visit: Payer: BLUE CROSS/BLUE SHIELD

## 2022-09-04 ENCOUNTER — Ambulatory Visit
Admission: RE | Admit: 2022-09-04 | Discharge: 2022-09-04 | Disposition: A | Discharge status: Home / Self Care | Payer: BLUE CROSS/BLUE SHIELD | Source: Ambulatory Visit | Attending: Radiation Oncology | Admitting: Radiation Oncology

## 2022-09-04 DIAGNOSIS — C61 Malignant neoplasm of prostate: Secondary | ICD-10-CM | POA: Diagnosis not present

## 2022-09-04 LAB — RAD ONC ARIA SESSION SUMMARY
Course Elapsed Days: 37
Plan Fractions Treated to Date: 24
Plan Prescribed Dose Per Fraction: 1.8 Gy
Plan Total Fractions Prescribed: 25
Plan Total Prescribed Dose: 45 Gy
Reference Point Dosage Given to Date: 43.2 Gy
Reference Point Session Dosage Given: 1.8 Gy
Session Number: 24

## 2022-09-05 ENCOUNTER — Ambulatory Visit
Admission: RE | Admit: 2022-09-05 | Discharge: 2022-09-05 | Disposition: A | Discharge status: Home / Self Care | Payer: BLUE CROSS/BLUE SHIELD | Source: Ambulatory Visit | Attending: Radiation Oncology | Admitting: Radiation Oncology

## 2022-09-05 ENCOUNTER — Other Ambulatory Visit: Payer: Self-pay

## 2022-09-05 DIAGNOSIS — C61 Malignant neoplasm of prostate: Secondary | ICD-10-CM | POA: Diagnosis not present

## 2022-09-05 LAB — RAD ONC ARIA SESSION SUMMARY
Course Elapsed Days: 38
Plan Fractions Treated to Date: 25
Plan Prescribed Dose Per Fraction: 1.8 Gy
Plan Total Fractions Prescribed: 25
Plan Total Prescribed Dose: 45 Gy
Reference Point Dosage Given to Date: 45 Gy
Reference Point Session Dosage Given: 1.8 Gy
Session Number: 25

## 2022-09-16 ENCOUNTER — Encounter: Payer: Self-pay | Admitting: *Deleted

## 2022-09-17 NOTE — Radiation Completion Notes (Addendum)
  Radiation Oncology         [phone] ________________________________  Name: [name] MRN: [id number]  Date: 09/05/2022  DOB: [birth year]  End of Treatment Note  Patient Name: Jason Mccarty, Jason Mccarty MRN: [id number] Date of Birth: [birth year] Referring Physician: Jerilee Field, M.D. Date of Service: 2022-09-17 Radiation Oncologist: Margaretmary Bayley, M.D. [facility] -      RADIATION ONCOLOGY END OF TREATMENT NOTE     Diagnosis:  62 y.o. gentleman with stage T1c adenocarcinoma of the prostate with a Gleason's score of 4+4 and a PSA of 10   Intent: Curative     ==========DELIVERED PLANS==========  First Treatment Date: 2022-07-29 - Last Treatment Date: 2022-09-05   Plan Name: Prostate_Pel Site: Prostate Technique: IMRT Mode: Photon Dose Per Fraction: 1.8 Gy Prescribed Dose (Delivered / Prescribed): 45 Gy / 45 Gy Prescribed Fxs (Delivered / Prescribed): 25 / 25   Plan Name: Prostate Seed Implant Site: Prostate Technique: Radioactive Seed Implant I-125 Mode: Brachytherapy Dose Per Fraction: 110 Gy Prescribed Dose (Delivered / Prescribed): 110 Gy / 110 Gy Prescribed Fxs (Delivered / Prescribed): 1 / 1     ==========ON TREATMENT VISIT DATES========== 2022-08-02, 2022-08-09, 2022-08-16, 2022-08-22, 2022-08-30, 2022-09-05   See weekly On Treatment Notes in Epic for details. The patient tolerated radiation treatment relatively well with only mild increased nocturia and occasional mild diarrhea.   The patient will receive a call in about one month from the radiation oncology department. He will continue follow up with his urologist, Dr. Mena Goes as well.  ------------------------------------------------   Margaretmary Dys, MD [facility]  Radiation Oncology Direct Dial: [phone]  Fax: [phone] [email or web address]  Skype  [email or web address]

## 2022-09-23 ENCOUNTER — Other Ambulatory Visit: Payer: Self-pay

## 2022-09-23 DIAGNOSIS — I5022 Chronic systolic (congestive) heart failure: Secondary | ICD-10-CM

## 2022-09-23 DIAGNOSIS — E1122 Type 2 diabetes mellitus with diabetic chronic kidney disease: Secondary | ICD-10-CM

## 2022-09-23 DIAGNOSIS — I10 Essential (primary) hypertension: Secondary | ICD-10-CM

## 2022-09-23 MED ORDER — FLUTICASONE PROPIONATE 50 MCG/ACT NA SUSP: 1.0000 | Freq: Every day | NASAL | 0 refills | Status: AC

## 2022-09-23 MED ORDER — CARVEDILOL 6.25 MG PO TABS: 6.2500 mg | ORAL_TABLET | Freq: Two times a day (BID) | ORAL | 1 refills | Status: AC

## 2022-09-23 MED ORDER — FUROSEMIDE 20 MG PO TABS: 20.0000 mg | ORAL_TABLET | Freq: Every day | ORAL | 1 refills | Status: AC

## 2022-09-23 MED ORDER — ENTRESTO 24-26 MG PO TABS: 1.0000 | ORAL_TABLET | Freq: Two times a day (BID) | ORAL | 0 refills | Status: DC

## 2022-09-23 MED ORDER — GLUCOSE BLOOD VI STRP: ORAL_STRIP | 11 refills | Status: AC

## 2022-09-23 MED ORDER — ACCU-CHEK FASTCLIX LANCETS MISC: 1 refills | Status: AC

## 2022-09-23 MED ORDER — AMLODIPINE BESYLATE 5 MG PO TABS: 5.0000 mg | ORAL_TABLET | Freq: Every day | ORAL | 1 refills | Status: DC

## 2022-09-23 MED ORDER — SILDENAFIL CITRATE 50 MG PO TABS: 50.0000 mg | ORAL_TABLET | ORAL | 0 refills | Status: AC | PRN

## 2022-09-23 NOTE — Telephone Encounter (Signed)
Next appt scheduled  6/26 with PCP. 

## 2022-09-23 NOTE — Telephone Encounter (Signed)
Requesting all meds to be filled @ [facility] on [address]. Please call pt back.

## 2022-09-25 ENCOUNTER — Other Ambulatory Visit: Payer: Self-pay | Admitting: Student

## 2022-09-25 DIAGNOSIS — C61 Malignant neoplasm of prostate: Secondary | ICD-10-CM

## 2022-09-25 NOTE — Telephone Encounter (Signed)
Next appt scheduled  6/26 with PCP. 

## 2022-09-26 ENCOUNTER — Other Ambulatory Visit: Payer: Self-pay | Admitting: Student

## 2022-09-26 DIAGNOSIS — C61 Malignant neoplasm of prostate: Secondary | ICD-10-CM

## 2022-09-26 NOTE — Assessment & Plan Note (Signed)
His last PET scan showed localized disease in left prostate but did also reveal possible uptake in left thyroid. MRI neck with contrast has been ordered, but not yet done. Will need to follow up to ensure this is completed.

## 2022-09-30 ENCOUNTER — Other Ambulatory Visit: Payer: Self-pay | Admitting: Student

## 2022-09-30 DIAGNOSIS — C61 Malignant neoplasm of prostate: Secondary | ICD-10-CM

## 2022-09-30 DIAGNOSIS — R946 Abnormal results of thyroid function studies: Secondary | ICD-10-CM

## 2022-10-04 ENCOUNTER — Other Ambulatory Visit: Payer: Self-pay | Admitting: Urology

## 2022-10-04 DIAGNOSIS — C61 Malignant neoplasm of prostate: Secondary | ICD-10-CM

## 2022-10-04 NOTE — Addendum Note (Signed)
Encounter addended by: Marcello Fennel, PA-C on: 10/04/2022 3:39 PM  Actions taken: Clinical Note Signed

## 2022-10-09 ENCOUNTER — Encounter: Payer: BLUE CROSS/BLUE SHIELD | Admitting: Student

## 2022-10-15 ENCOUNTER — Ambulatory Visit
Admission: RE | Admit: 2022-10-15 | Discharge: 2022-10-15 | Disposition: A | Discharge status: Home / Self Care | Payer: BLUE CROSS/BLUE SHIELD | Source: Ambulatory Visit | Attending: Radiation Oncology | Admitting: Radiation Oncology

## 2022-10-15 NOTE — Progress Notes (Signed)
2 nocturia 6mon   Radiation Oncology         [phone]) [phone] ________________________________  Name: Jason Mccarty MRN: [id number]  Date of Service: 10/15/2022  DOB: 11/06/1960  Post Treatment Telephone Note  Diagnosis:  (as documented in provider EOT note)   Pre Treatment IPSS Score: 14 (as documented in the provider consult note)   The patient was available for call today.   Symptoms of fatigue have improved since completing therapy.  Symptoms of bladder changes have improved since completing therapy. Current symptoms include none, and medications for bladder symptoms include Tamsulosin.  Symptoms of bowel changes have improved since completing therapy. Current symptoms include none, and medications for bowel symptoms include none.     Post Treatment IPSS Score: IPSS Questionnaire (AUA-7): Over the past month.   1)  How often have you had a sensation of not emptying your bladder completely after you finish urinating?  0 - Not at all  2)  How often have you had to urinate again less than two hours after you finished urinating? 0 - Not at all  3)  How often have you found you stopped and started again several times when you urinated?  0 - Not at all  4) How difficult have you found it to postpone urination?  0 - Not at all  5) How often have you had a weak urinary stream?  1 - Less than 1 time in 5  6) How often have you had to push or strain to begin urination?  0 - Not at all  7) How many times did you most typically get up to urinate from the time you went to bed until the time you got up in the morning?  2 - 2 times  Total score:  2. Which indicates mild symptoms  0-7 mildly symptomatic   8-19 moderately symptomatic   20-35 severely symptomatic    Patient has a scheduled follow up visit with his urologist, Dr. Mena Goes , on x 6 months for ongoing surveillance. He was counseled that PSA levels will be drawn in the urology office, and was reassured that additional time is  expected to improve bowel and bladder symptoms. He was encouraged to call back with concerns or questions regarding radiation.  This concludes the interaction.  Ruel Favors, LPN

## 2022-10-16 ENCOUNTER — Encounter (HOSPITAL_COMMUNITY): Payer: Self-pay

## 2022-10-16 ENCOUNTER — Ambulatory Visit (HOSPITAL_COMMUNITY): Payer: BLUE CROSS/BLUE SHIELD

## 2022-11-11 ENCOUNTER — Encounter: Payer: Self-pay | Admitting: *Deleted

## 2022-12-03 ENCOUNTER — Encounter: Payer: Self-pay | Admitting: *Deleted

## 2022-12-03 ENCOUNTER — Inpatient Hospital Stay: Payer: BLUE CROSS/BLUE SHIELD | Attending: Adult Health | Admitting: *Deleted

## 2022-12-03 DIAGNOSIS — C61 Malignant neoplasm of prostate: Secondary | ICD-10-CM

## 2022-12-03 NOTE — Progress Notes (Signed)
SCP reviewed and completed. 

## 2022-12-09 ENCOUNTER — Encounter: Payer: BLUE CROSS/BLUE SHIELD | Admitting: *Deleted

## 2022-12-10 ENCOUNTER — Telehealth: Payer: Self-pay | Admitting: Student

## 2022-12-10 NOTE — Telephone Encounter (Signed)
Return call to pt . States he tested Covid + last night. C/o chills, coughing and sneezing. Denies CP and fever. He has not taken any OTC meds; only his regular meds. States he feels better today. Pt is schedule to come in Thursday 8/29. He wants to know if medication can be prescribe.

## 2022-12-10 NOTE — Telephone Encounter (Signed)
Pt took a home test and tested positive for Covid today. Pt wants to know what can he take.  Pt is sch to come in on 12/03/2022 already but, states he needs some medication.

## 2022-12-11 NOTE — Telephone Encounter (Signed)
Called pt - informed of Dr Eliane Decree response if he's feeling better, med is not needed. Pt stated he feels good. Informed pt we will re-schedule his appt which was tomorrow(d/t covid+) - to first available 9/10 @ 11AM with his PCP. Advised pt to re-test himself after 5 day isolation period and wear a mask.

## 2022-12-12 ENCOUNTER — Encounter: Payer: BLUE CROSS/BLUE SHIELD | Admitting: Student

## 2022-12-24 ENCOUNTER — Encounter: Payer: Self-pay | Admitting: Student

## 2022-12-24 ENCOUNTER — Ambulatory Visit (INDEPENDENT_AMBULATORY_CARE_PROVIDER_SITE_OTHER): Payer: BLUE CROSS/BLUE SHIELD | Admitting: Student

## 2022-12-24 VITALS — BP 134/65 | HR 55 | Temp 97.7°F | Ht 72.0 in | Wt 304.0 lb

## 2022-12-24 DIAGNOSIS — N1831 Chronic kidney disease, stage 3a: Secondary | ICD-10-CM

## 2022-12-24 DIAGNOSIS — Z7984 Long term (current) use of oral hypoglycemic drugs: Secondary | ICD-10-CM

## 2022-12-24 DIAGNOSIS — I130 Hypertensive heart and chronic kidney disease with heart failure and stage 1 through stage 4 chronic kidney disease, or unspecified chronic kidney disease: Secondary | ICD-10-CM | POA: Diagnosis not present

## 2022-12-24 DIAGNOSIS — I5022 Chronic systolic (congestive) heart failure: Secondary | ICD-10-CM | POA: Diagnosis not present

## 2022-12-24 DIAGNOSIS — I10 Essential (primary) hypertension: Secondary | ICD-10-CM

## 2022-12-24 DIAGNOSIS — Z23 Encounter for immunization: Secondary | ICD-10-CM

## 2022-12-24 DIAGNOSIS — E1122 Type 2 diabetes mellitus with diabetic chronic kidney disease: Secondary | ICD-10-CM

## 2022-12-24 LAB — POCT GLYCOSYLATED HEMOGLOBIN (HGB A1C): Hemoglobin A1C: 7.6 % — AB (ref 4.0–5.6)

## 2022-12-24 LAB — GLUCOSE, CAPILLARY: Glucose-Capillary: 149 mg/dL — ABNORMAL HIGH (ref 70–99)

## 2022-12-24 MED ORDER — AMLODIPINE BESYLATE 5 MG PO TABS
5.0000 mg | ORAL_TABLET | Freq: Every day | ORAL | 1 refills | Status: DC
Start: 2022-12-24 — End: 2023-01-23

## 2022-12-24 MED ORDER — ENTRESTO 24-26 MG PO TABS: 1.0000 | ORAL_TABLET | Freq: Two times a day (BID) | ORAL | 0 refills | Status: DC

## 2022-12-24 MED ORDER — GABAPENTIN 100 MG PO CAPS
100.0000 mg | ORAL_CAPSULE | Freq: Two times a day (BID) | ORAL | 0 refills | Status: AC
Start: 2022-12-24 — End: 2023-03-24

## 2022-12-24 NOTE — Patient Instructions (Signed)
Thank you, Jason Mccarty for allowing [place] to provide your care today. Today we discussed your diabetes, hypertension, and heart failure.    As we discussed, we have collected a hemoglobin A1c and a BMP today.  As soon as we have the results, we will let you know.  We will likely increase your Farxiga from 5 mg to 10 mg.  You may take 2 of the 5 mg tablets daily until you run out, then switch to the 10 mg tablets once a day.  For your neuropathy, we have prescribed gabapentin 100 mg twice daily to help with the pain.  For your heart failure, we have put in a referral for you to follow-up with Dr. Servando Salina with cardiology. They should be contacting you shortly. We will not be making any changes to your blood pressure or heart failure medications at this time.  I have ordered the following labs for you:   Lab Orders         BMP8+Anion Gap         Glucose, capillary         POC Hbg A1C      Referrals ordered today:    Referral Orders         Ambulatory referral to Cardiology      I have ordered the following medication/changed the following medications:   Start the following medications: Meds ordered this encounter  Medications   amLODipine (NORVASC) 5 MG tablet    Sig: Take 1 tablet (5 mg total) by mouth daily.    Dispense:  60 tablet    Refill:  1   sacubitril-valsartan (ENTRESTO) 24-26 MG    Sig: Take 1 tablet by mouth 2 (two) times daily.    Dispense:  60 tablet    Refill:  0   gabapentin (NEURONTIN) 100 MG capsule    Sig: Take 1 capsule (100 mg total) by mouth 2 (two) times daily.    Dispense:  180 capsule    Refill:  0     Follow up: 3 months   We look forward to seeing you next time. Please call our clinic at [phone] if you have any questions or concerns. The best time to call is Monday-Friday from 9am-4pm, but there is someone available 24/7. If after hours or the weekend, call the main hospital number and ask for the Internal Medicine Resident On-Call. If you need  medication refills, please notify your pharmacy one week in advance and they will send [place] a request.   Thank you for trusting me with your care. Wishing you the best!   Annett Fabian, MD [facility]

## 2022-12-24 NOTE — Assessment & Plan Note (Addendum)
Patient has known HFrEF with an ejection fraction of 50 to 55% on GDMT, which includes carvedilol 6.25 mg twice daily, Farxiga 5 mg daily, spironolactone 25 mg daily, and Entresto 24-26 twice daily.  He has 1+ pitting edema bilaterally in his lower extremities.  He denies orthopnea.  Lungs are clear to auscultation bilaterally.  He says he has been taking his Lasix twice weekly, which has been helping with his swelling. He used to follow with cardiology but has not seen them recently and I do not see any future appointments scheduled, so I will put in a referral today to make sure he follows up with them.  Plan: - Continue carvedilol, spironolactone, Entresto, Lasix - Increase Farxiga to 10 mg pending BMP

## 2022-12-24 NOTE — Assessment & Plan Note (Signed)
Patient has chronic kidney disease with a baseline function consistent with CKD stage IIIa.  Baseline creatinine appears to be around 1.5.  We will get a BMP today to check his kidney function.

## 2022-12-24 NOTE — Progress Notes (Signed)
CC: Diabetes, HTN follow up  HPI:  [name] is a 62 y.o. male living with a history stated below and presents today for Diabetes, HTN follow up. Please see problem based assessment and plan for additional details.  Past Medical History:  Diagnosis Date   Bilateral lower extremity edema    BPH associated with nocturia    CKD (chronic kidney disease), stage III (HCC)    Heart failure with reduced ejection fraction (HCC) 04/2021   cardiologist--- dr Servando Salina   History of acute respiratory failure 05/11/2021   admission in epic;  in setting HTN urgency bp 300s w/ ARF w/ hypercapnia (sts 70s) w/ heart failure AKI/ new dx DM2/  hepatocellular injury   Ischemic cardiomyopathy 04/2021   per echo 01/ 2023 ef 40-45%;  echo 05-15-2022 50-55%   Malignant neoplasm prostate (HCC) 01/2022   urologist-  dr eskridge/  radiation oncologist--- dr Kathrynn Running:  dx 10/ 2023, gleason 4+4   Obesity, Class III, BMI 40-49.9 (morbid obesity) (HCC)    OSA on CPAP    per pt uses nightly   Severe hypertension    followed by cardiology and pcp   Type 2 diabetes mellitus (HCC) 04/2021   followed by pcp   Wears glasses    Wears partial dentures    1990s    Current Outpatient Medications on File Prior to Visit  Medication Sig Dispense Refill   Accu-Chek FastClix Lancets MISC USE AS DIRECTED 102 each 1   atorvastatin (LIPITOR) 40 MG tablet Take 1 tablet (40 mg total) by mouth daily. (Patient taking differently: Take 40 mg by mouth daily.) 90 tablet 3   blood glucose meter kit and supplies KIT Use up to one time daily as directed. 1 each 0   carvedilol (COREG) 6.25 MG tablet Take 1 tablet (6.25 mg total) by mouth 2 (two) times daily with a meal. 180 tablet 1   cetirizine (ZYRTEC) 10 MG tablet Take 10 mg by mouth daily.     dapagliflozin propanediol (FARXIGA) 5 MG TABS tablet Take 1 tablet (5 mg total) by mouth daily before breakfast. 30 tablet 2   fluticasone (FLONASE) 50 MCG/ACT nasal spray Place 1 spray  into both nostrils daily. (Patient taking differently: Place 1 spray into both nostrils as needed.) 16 g 0   furosemide (LASIX) 20 MG tablet Take 1 tablet (20 mg total) by mouth daily. (Patient taking differently: Take 20 mg by mouth 2 (two) times a week.) 90 tablet 1   glucose blood test strip Use up to 1 time daily. 30 each 11   metFORMIN (GLUCOPHAGE) 500 MG tablet Take 2 tablets (1,000 mg total) by mouth daily with breakfast. 180 tablet 1   Multiple Vitamins-Minerals (MENS MULTIVITAMIN PLUS PO) Take by mouth daily.     sildenafil (VIAGRA) 50 MG tablet Take 1 tablet (50 mg total) by mouth as needed for erectile dysfunction. Take 1 hour before sexual activity. If you experience headache, reduce to half tablet. 10 tablet 0   sodium chloride (OCEAN) 0.65 % SOLN nasal spray Place 1 spray into both nostrils as needed for congestion. (Patient not taking: Reported on 12/03/2022)     solifenacin (VESICARE) 5 MG tablet Take 1 tablet (5 mg total) by mouth daily. 60 tablet 1   spironolactone (ALDACTONE) 25 MG tablet Take 1 tablet (25 mg total) by mouth daily. 90 tablet 3   tamsulosin (FLOMAX) 0.4 MG CAPS capsule TAKE 1 CAPSULE BY MOUTH ONCE DAILY AFTER BREAKFAST 30 capsule 0  No current facility-administered medications on file prior to visit.   Family History  Problem Relation Age of Onset   Heart failure Father    Hypertension Father    Social History   Socioeconomic History   Marital status: Single    Spouse name: Not on file   Number of children: Not on file   Years of education: Not on file   Highest education level: Not on file  Occupational History   Not on file  Tobacco Use   Smoking status: Some Days    Types: Cigarettes   Smokeless tobacco: Never   Tobacco comments:    06-24-2022  per pt smokes 1-2 per week,  smoked since age 81  Vaping Use   Vaping status: Never Used  Substance and Sexual Activity   Alcohol use: Not Currently    Comment: very rare   Drug use: Not Currently     Comment: 06-24-2022 per pt many yrs ago   Sexual activity: Not on file  Other Topics Concern   Not on file  Social History Narrative   Not on file   Social Determinants of Health   Financial Resource Strain: Low Risk  (08/15/2022)   Overall Financial Resource Strain (CARDIA)    Difficulty of Paying Living Expenses: Not hard at all  Food Insecurity: No Food Insecurity (08/15/2022)   Hunger Vital Sign    Worried About Running Out of Food in the Last Year: Never true    Ran Out of Food in the Last Year: Never true  Transportation Needs: No Transportation Needs (08/15/2022)   PRAPARE - Administrator, Civil Service (Medical): No    Lack of Transportation (Non-Medical): No  Physical Activity: Inactive (08/15/2022)   Exercise Vital Sign    Days of Exercise per Week: 0 days    Minutes of Exercise per Session: 0 min  Stress: No Stress Concern Present (04/02/2022)   [employer] of Occupational Health - Occupational Stress Questionnaire    Feeling of Stress : Not at all  Social Connections: Moderately Isolated (08/15/2022)   Social Connection and Isolation Panel [NHANES]    Frequency of Communication with Friends and Family: More than three times a week    Frequency of Social Gatherings with Friends and Family: More than three times a week    Attends Religious Services: More than 4 times per year    Active Member of [employer] or Organizations: No    Attends Banker Meetings: Never    Marital Status: Widowed  Intimate Partner Violence: Not At Risk (08/15/2022)   Humiliation, Afraid, Rape, and Kick questionnaire    Fear of Current or Ex-Partner: No    Emotionally Abused: No    Physically Abused: No    Sexually Abused: No   Review of Systems: ROS negative except for what is noted on the assessment and plan.  Vitals:   12/24/22 1034  BP: 134/65  Pulse: (!) 55  Temp: 97.7 F (36.5 C)  TempSrc: Oral  SpO2: 100%  Weight: (!) 304 lb (137.9 kg)  Height: 6' (1.829 m)    Physical Exam: Constitutional: well-appearing, in no acute distress HENT: normocephalic atraumatic, mucous membranes moist Eyes: conjunctiva non-erythematous Cardiovascular: regular rate and rhythm, no m/r/g; 1+ pitting edema bilaterally Pulmonary/Chest: normal work of breathing on room air, lungs clear to auscultation bilaterally Abdominal: soft, non-tender, non-distended MSK: normal bulk and tone Neurological: alert & oriented x 3, no focal deficit Skin: warm and dry Psych: normal mood  and behavior Foot exam: no decreased sensation, no ulcers noted  Assessment & Plan:   Patient seen with Dr. Heide Spark  Type 2 diabetes mellitus with kidney complication, without long-term current use of insulin (HCC) Patient has a history of type 2 diabetes complicated by chronic kidney disease.  His A1c today was 7.6%, up from 7.2% on 08/15/2022.  He is currently taking metformin 1000 mg daily and Farxiga 5 mg daily.  Denies any side effects these medications.  He is current complaining of neuropathic symptoms in his bilateral lower extremities, predominantly numbness in the big toe of both feet and sometimes in his knees and soles.  Plan: -BMP, A1c today -Gabapentin 100 mg BID for neuropathy -If BMP normal, will increase Farxiga to 10 mg daily -Will consider adding GLP-1 at next visit -Follow up in 1 month  CKD (chronic kidney disease), stage III (HCC) Patient has chronic kidney disease with a baseline function consistent with CKD stage IIIa.  Baseline creatinine appears to be around 1.5.  We will get a BMP today to check his kidney function.  Chronic HFrEF (heart failure with reduced ejection fraction) (HCC) Patient has known HFrEF with an ejection fraction of 50 to 55% on GDMT, which includes carvedilol 6.25 mg twice daily, Farxiga 5 mg daily, spironolactone 25 mg daily, and Entresto 24-26 twice daily.  He has 1+ pitting edema bilaterally in his lower extremities.  He denies orthopnea.  Lungs are  clear to auscultation bilaterally.  He says he has been taking his Lasix twice weekly, which has been helping with his swelling. He used to follow with cardiology but has not seen them recently and I do not see any future appointments scheduled, so I will put in a referral today to make sure he follows up with them.  Plan: - Continue carvedilol, spironolactone, Entresto, Lasix - Increase Farxiga to 10 mg pending BMP  Severe hypertension Patient known history of hypertension.  Blood pressure today is 134/65, which is near his goal of 130/80.  He is currently taking amlodipine 5 mg daily, in addition to his GDMT medications.  He is asymptomatic.  At the prior visit, there was some consideration of stopping Norvasc given his blood pressure being low.  At this time, his blood pressure is near his target, so we will not make any change to Norvasc at this time.   Annett Fabian, MD  [facility] Internal Medicine, PGY-1 Phone: [phone] Date 12/24/2022 Time 4:29 PM

## 2022-12-24 NOTE — Assessment & Plan Note (Addendum)
Patient known history of hypertension.  Blood pressure today is 134/65, which is near his goal of 130/80.  He is currently taking amlodipine 5 mg daily, in addition to his GDMT medications.  He is asymptomatic.  At the prior visit, there was some consideration of stopping Norvasc given his blood pressure being low.  At this time, his blood pressure is near his target, so we will not make any change to Norvasc at this time.

## 2022-12-24 NOTE — Assessment & Plan Note (Addendum)
Patient has a history of type 2 diabetes complicated by chronic kidney disease.  His A1c today was 7.6%, up from 7.2% on 08/15/2022.  He is currently taking metformin 1000 mg daily and Farxiga 5 mg daily.  Denies any side effects these medications.  He is current complaining of neuropathic symptoms in his bilateral lower extremities, predominantly numbness in the big toe of both feet and sometimes in his knees and soles.  Plan: -BMP, A1c today -Gabapentin 100 mg BID for neuropathy -If BMP normal, will increase Farxiga to 10 mg daily -Will consider adding GLP-1 at next visit -Follow up in 1 month

## 2022-12-25 LAB — BMP8+ANION GAP
Anion Gap: 17 mmol/L (ref 10.0–18.0)
BUN/Creatinine Ratio: 18 (ref 10–24)
BUN: 23 mg/dL (ref 8–27)
CO2: 21 mmol/L (ref 20–29)
Calcium: 9.3 mg/dL (ref 8.6–10.2)
Chloride: 102 mmol/L (ref 96–106)
Creatinine, Ser: 1.29 mg/dL — ABNORMAL HIGH (ref 0.76–1.27)
Glucose: 144 mg/dL — ABNORMAL HIGH (ref 70–99)
Potassium: 4.1 mmol/L (ref 3.5–5.2)
Sodium: 140 mmol/L (ref 134–144)
eGFR: 62 mL/min/{1.73_m2} (ref >59)

## 2022-12-25 MED ORDER — DAPAGLIFLOZIN PROPANEDIOL 10 MG PO TABS: 10.0000 mg | ORAL_TABLET | Freq: Every day | ORAL | 0 refills | Status: AC

## 2022-12-25 NOTE — Progress Notes (Signed)
Internal Medicine Clinic Attending  I was physically present during the key portions of the resident provided service and participated in the medical decision making of patient's management care. I reviewed pertinent patient test results.  The assessment, diagnosis, and plan were formulated together and I agree with the documentation in the resident's note.  Narendra, Nischal, MD  

## 2022-12-25 NOTE — Addendum Note (Signed)
Addended by: Annett Fabian on: 12/25/2022 01:26 PM   Modules accepted: Orders

## 2023-01-14 ENCOUNTER — Ambulatory Visit
Admission: EM | Admit: 2023-01-14 | Discharge: 2023-01-14 | Disposition: A | Discharge status: Home / Self Care | Payer: BLUE CROSS/BLUE SHIELD | Attending: Internal Medicine | Admitting: Internal Medicine

## 2023-01-14 DIAGNOSIS — N3000 Acute cystitis without hematuria: Secondary | ICD-10-CM | POA: Diagnosis not present

## 2023-01-14 DIAGNOSIS — R300 Dysuria: Secondary | ICD-10-CM | POA: Diagnosis present

## 2023-01-14 DIAGNOSIS — R051 Acute cough: Secondary | ICD-10-CM | POA: Diagnosis present

## 2023-01-14 DIAGNOSIS — J069 Acute upper respiratory infection, unspecified: Secondary | ICD-10-CM | POA: Diagnosis present

## 2023-01-14 DIAGNOSIS — R350 Frequency of micturition: Secondary | ICD-10-CM | POA: Insufficient documentation

## 2023-01-14 LAB — POCT URINALYSIS DIP (MANUAL ENTRY)
Bilirubin, UA: NEGATIVE
Blood, UA: NEGATIVE
Glucose, UA: 100 mg/dL — AB
Ketones, POC UA: NEGATIVE mg/dL
Nitrite, UA: NEGATIVE
Protein Ur, POC: NEGATIVE mg/dL
Spec Grav, UA: >=1.03 — AB (ref 1.010–1.025)
Urobilinogen, UA: 0.2 U/dL
pH, UA: 6 (ref 5.0–8.0)

## 2023-01-14 LAB — POCT FASTING CBG KUC MANUAL ENTRY: POCT Glucose (KUC): 295 mg/dL — AB (ref 70–99)

## 2023-01-14 MED ORDER — BENZONATATE 100 MG PO CAPS
100.0000 mg | ORAL_CAPSULE | Freq: Three times a day (TID) | ORAL | 0 refills | Status: AC | PRN
Start: 2023-01-14 — End: ?

## 2023-01-14 MED ORDER — CEFDINIR 300 MG PO CAPS
300.0000 mg | ORAL_CAPSULE | Freq: Two times a day (BID) | ORAL | 0 refills | Status: AC
Start: 2023-01-14 — End: 2023-01-21

## 2023-01-14 NOTE — ED Triage Notes (Signed)
Pt presents with cough, sneezing, eyes and nose running, sinus headache x > 7 days. Treated with Zyrtec.  Pt also states he have burning while urinating and low back pain x 2 weeks.

## 2023-01-14 NOTE — Discharge Instructions (Signed)
I have prescribed you an antibiotic to treat upper respiratory infection and possible UTI.  Urine culture is pending.  We will call when it results.  Ensure you are drinking adequate water.  Cough medication has also been prescribed.  Monitor glucose very closely at home.

## 2023-01-14 NOTE — ED Provider Notes (Signed)
[name]    CSN: [id number] Arrival date & time: 01/14/23  0805      History   Chief Complaint Chief Complaint  Patient presents with   Cough    HPI Sabatino Williard is a 62 y.o. male.   Patient presents with several different chief complaints today.  Patient reports cough, sneezing, sinus pressure, runny eyes, runny nose, headache that started about 7 days ago.  Denies any fever, body aches, chills.  Denies any known sick contacts.  Denies chest pain or shortness of breath.  Has taken Zyrtec for symptoms with minimal improvement.  Denies history of asthma or COPD.  Reports that he smokes cigarettes "occasionally".  Patient also reporting some urinary burning and frequency that started about a week ago.  Reports that he got a urinary tract infection previously post radiation for prostate cancer.  Last UTI was approximately 1 year ago.  Denies hematuria, abdominal pain, fever related to this but does report some lower back pain that started around the same time.   Cough   Past Medical History:  Diagnosis Date   Bilateral lower extremity edema    BPH associated with nocturia    CKD (chronic kidney disease), stage III (HCC)    Heart failure with reduced ejection fraction (HCC) 04/2021   cardiologist--- dr Servando Salina   History of acute respiratory failure 05/11/2021   admission in epic;  in setting HTN urgency bp 300s w/ ARF w/ hypercapnia (sts 70s) w/ heart failure AKI/ new dx DM2/  hepatocellular injury   Ischemic cardiomyopathy 04/2021   per echo 01/ 2023 ef 40-45%;  echo 05-15-2022 50-55%   Malignant neoplasm prostate (HCC) 01/2022   urologist-  dr eskridge/  radiation oncologist--- dr Kathrynn Running:  dx 10/ 2023, gleason 4+4   Obesity, Class III, BMI 40-49.9 (morbid obesity) (HCC)    OSA on CPAP    per pt uses nightly   Severe hypertension    followed by cardiology and pcp   Type 2 diabetes mellitus (HCC) 04/2021   followed by pcp   Wears glasses    Wears partial  dentures    1990s    Patient Active Problem List   Diagnosis Date Noted   Abnormal thyroid uptake 04/03/2022   Malignant neoplasm of prostate (HCC) 02/22/2022   Urinary frequency 11/19/2021   Encounter for screening colonoscopy 07/24/2021   ED (erectile dysfunction) 06/19/2021   Tobacco use disorder 05/17/2021   Severe hypertension 05/11/2021   Chronic HFrEF (heart failure with reduced ejection fraction) (HCC) 05/11/2021   Obstructive sleep apnea 05/11/2021   CKD (chronic kidney disease), stage III (HCC) 05/11/2021   Type 2 diabetes mellitus with kidney complication, without long-term current use of insulin (HCC) 05/11/2021   Morbid obesity (HCC) 05/11/2021    Past Surgical History:  Procedure Laterality Date   RADIOACTIVE SEED IMPLANT N/A 06/25/2022   Procedure: RADIOACTIVE SEED IMPLANT/BRACHYTHERAPY IMPLANT;  Surgeon: Jerilee Field, MD;  Location: [facility] [place];  Service: Urology;  Laterality: N/A;   SPACE OAR INSTILLATION N/A 06/25/2022   Procedure: SPACE OAR INSTILLATION;  Surgeon: Jerilee Field, MD;  Location: [facility];  Service: Urology;  Laterality: N/A;   UMBILICAL HERNIA REPAIR     1990s       Home Medications    Prior to Admission medications   Medication Sig Start Date End Date Taking? Authorizing Provider  benzonatate (TESSALON) 100 MG capsule Take 1 capsule (100 mg total) by mouth every 8 (eight) hours as needed  for cough. 01/14/23  Yes Rupert Azzara, Rolly Salter E, FNP  cefdinir (OMNICEF) 300 MG capsule Take 1 capsule (300 mg total) by mouth 2 (two) times daily for 7 days. 01/14/23 01/21/23 Yes Gustavus Bryant, FNP  Accu-Chek FastClix Lancets MISC USE AS DIRECTED 09/23/22   Briscoe Burns, MD  amLODipine (NORVASC) 5 MG tablet Take 1 tablet (5 mg total) by mouth daily. 12/24/22 09/20/23  Annett Fabian, MD  atorvastatin (LIPITOR) 40 MG tablet Take 1 tablet (40 mg total) by mouth daily. Patient taking differently: Take 40 mg by mouth daily.  06/14/22   Briscoe Burns, MD  blood glucose meter kit and supplies KIT Use up to one time daily as directed. 06/26/21   Briscoe Burns, MD  carvedilol (COREG) 6.25 MG tablet Take 1 tablet (6.25 mg total) by mouth 2 (two) times daily with a meal. 09/23/22   Briscoe Burns, MD  cetirizine (ZYRTEC) 10 MG tablet Take 10 mg by mouth daily.    [provider]  dapagliflozin propanediol (FARXIGA) 10 MG TABS tablet Take 1 tablet (10 mg total) by mouth daily before breakfast. 12/25/22 03/25/23  Annett Fabian, MD  fluticasone [facility]) 50 MCG/ACT nasal spray Place 1 spray into both nostrils daily. Patient taking differently: Place 1 spray into both nostrils as needed. 09/23/22   Briscoe Burns, MD  furosemide (LASIX) 20 MG tablet Take 1 tablet (20 mg total) by mouth daily. Patient taking differently: Take 20 mg by mouth 2 (two) times a week. 09/23/22   Briscoe Burns, MD  gabapentin (NEURONTIN) 100 MG capsule Take 1 capsule (100 mg total) by mouth 2 (two) times daily. 12/24/22 03/24/23  Annett Fabian, MD  glucose blood test strip Use up to 1 time daily. 09/23/22   Briscoe Burns, MD  metFORMIN (GLUCOPHAGE) 500 MG tablet Take 2 tablets (1,000 mg total) by mouth daily with breakfast. 07/24/22   Briscoe Burns, MD  Multiple Vitamins-Minerals (MENS MULTIVITAMIN PLUS PO) Take by mouth daily.    [provider]  sacubitril-valsartan (ENTRESTO) 24-26 MG Take 1 tablet by mouth 2 (two) times daily. 12/24/22   Annett Fabian, MD  sildenafil (VIAGRA) 50 MG tablet Take 1 tablet (50 mg total) by mouth as needed for erectile dysfunction. Take 1 hour before sexual activity. If you experience headache, reduce to half tablet. 09/23/22 09/23/23  Briscoe Burns, MD  sodium chloride (OCEAN) 0.65 % SOLN nasal spray Place 1 spray into both nostrils as needed for congestion. Patient not taking: Reported on 12/03/2022    [provider]  solifenacin (VESICARE) 5 MG tablet Take 1 tablet (5 mg total)  by mouth daily. 08/15/22   Briscoe Burns, MD  spironolactone (ALDACTONE) 25 MG tablet Take 1 tablet (25 mg total) by mouth daily. 06/14/22   Briscoe Burns, MD  tamsulosin (FLOMAX) 0.4 MG CAPS capsule TAKE 1 CAPSULE BY MOUTH ONCE DAILY AFTER BREAKFAST 09/25/22   Briscoe Burns, MD    Family History Family History  Problem Relation Age of Onset   Heart failure Father    Hypertension Father     Social History Social History   Tobacco Use   Smoking status: Some Days    Types: Cigarettes   Smokeless tobacco: Never   Tobacco comments:    06-24-2022  per pt smokes 1-2 per week,  smoked since age 74  Vaping Use   Vaping status: Never Used  Substance Use Topics   Alcohol use: Not Currently  Comment: very rare   Drug use: Not Currently    Comment: 06-24-2022 per pt many yrs ago     Allergies   Benadryl [diphenhydramine]   Review of Systems Review of Systems Per HPI  Physical Exam Triage Vital Signs ED Triage Vitals  Encounter Vitals Group     BP 01/14/23 0821 128/74     Systolic BP Percentile --      Diastolic BP Percentile --      Pulse Rate 01/14/23 0821 66     Resp --      Temp 01/14/23 0817 98.2 F (36.8 C)     Temp Source 01/14/23 0817 Oral     SpO2 01/14/23 0821 100 %     Weight 01/14/23 0822 (!) 302 lb (137 kg)     Height 01/14/23 0822 5\' 10"  (1.778 m)     Head Circumference --      Peak Flow --      Pain Score 01/14/23 0821 5     Pain Loc --      Pain Education --      Exclude from Growth Chart --    No data found.  Updated Vital Signs BP 128/74 (BP Location: Left Arm)   Pulse 66   Temp 98.2 F (36.8 C) (Oral)   Ht 5\' 10"  (1.778 m)   Wt (!) 302 lb (137 kg)   SpO2 100%   BMI 43.33 kg/m   Visual Acuity Right Eye Distance:   Left Eye Distance:   Bilateral Distance:    Right Eye Near:   Left Eye Near:    Bilateral Near:     Physical Exam Constitutional:      General: He is not in acute distress.    Appearance: Normal appearance. He  is not toxic-appearing or diaphoretic.  HENT:     Head: Normocephalic and atraumatic.     Right Ear: Tympanic membrane and ear canal normal.     Left Ear: Tympanic membrane and ear canal normal.     Nose: Congestion present.     Mouth/Throat:     Mouth: Mucous membranes are moist.     Pharynx: No posterior oropharyngeal erythema.  Eyes:     Extraocular Movements: Extraocular movements intact.     Conjunctiva/sclera: Conjunctivae normal.     Pupils: Pupils are equal, round, and reactive to light.  Cardiovascular:     Rate and Rhythm: Normal rate and regular rhythm.     Pulses: Normal pulses.     Heart sounds: Normal heart sounds.  Pulmonary:     Effort: Pulmonary effort is normal. No respiratory distress.     Breath sounds: Normal breath sounds. No stridor. No wheezing, rhonchi or rales.  Abdominal:     General: Abdomen is flat. Bowel sounds are normal.     Palpations: Abdomen is soft.     Tenderness: There is no right CVA tenderness or left CVA tenderness.  Musculoskeletal:        General: Normal range of motion.     Cervical back: Normal range of motion.  Skin:    General: Skin is warm and dry.  Neurological:     General: No focal deficit present.     Mental Status: He is alert and oriented to person, place, and time. Mental status is at baseline.  Psychiatric:        Mood and Affect: Mood normal.        Behavior: Behavior normal.  Thought Content: Thought content normal.        Judgment: Judgment normal.      UC Treatments / Results  Labs (all labs ordered are listed, but only abnormal results are displayed) Labs Reviewed  POCT URINALYSIS DIP (MANUAL ENTRY) - Abnormal; Notable for the following components:      Result Value   Clarity, UA cloudy (*)    Glucose, UA =100 (*)    Spec Grav, UA >=1.030 (*)    Leukocytes, UA Trace (*)    All other components within normal limits  POCT FASTING CBG [facility] MANUAL ENTRY - Abnormal; Notable for the following components:    POCT Glucose ([facility]) 295 (*)    All other components within normal limits  URINE CULTURE    EKG   Radiology No results found.  Procedures Procedures (including critical care time)  Medications Ordered in UC Medications - No data to display  Initial Impression / Assessment and Plan / UC Course  I have reviewed the triage vital signs and the nursing notes.  Pertinent labs & imaging results that were available during my care of the patient were reviewed by me and considered in my medical decision making (see chart for details).     1.  Acute upper respiratory infection  Given duration of time of symptoms and sinus pressure, I am concerned for sinus infection/secondary bacterial infection.  Therefore, will treat with antibiotic therapy today.  There are no adventitious lung sounds on exam so do not think that chest imaging is necessary.  Will prescribe benzonatate to take as needed for cough as well.  Advised supportive care and symptom management.  Advised strict follow-up if symptoms persist or worsen.  2.  Urinary tract infection  UA is showing trace leukocytes but with associated symptoms and previous history of UTI, will opt to treat with antibiotic today to treat UTI while awaiting urine culture.  Cefdinir should treat upper respiratory infection as well as UTI.  Urine culture is pending.  Glucose completed today given glucose in urine to ensure that hyperglycemia was not causing dysuria.  Blood sugar was 295 today so this definitely could be contributing but with white blood cells, will opt to treat with antibiotic therapy today.  Advised patient to monitor his glucose very closely at home over the next few days and follow-up with PCP or urgent care if it remains elevated.  Patient verbalized understanding and was agreeable with plan. Final Clinical Impressions(s) / UC Diagnoses   Final diagnoses:  Acute upper respiratory infection  Acute cough  Acute cystitis without  hematuria  Dysuria  Urinary frequency     Discharge Instructions      I have prescribed you an antibiotic to treat upper respiratory infection and possible UTI.  Urine culture is pending.  We will call when it results.  Ensure you are drinking adequate water.  Cough medication has also been prescribed.  Monitor glucose very closely at home.    ED Prescriptions     Medication Sig Dispense Auth. Provider   cefdinir (OMNICEF) 300 MG capsule Take 1 capsule (300 mg total) by mouth 2 (two) times daily for 7 days. 14 capsule [place], [place], [place]   benzonatate (TESSALON) 100 MG capsule Take 1 capsule (100 mg total) by mouth every 8 (eight) hours as needed for cough. 21 capsule [place], Acie Fredrickson, [place]      PDMP not reviewed this encounter.   Gustavus Bryant, [place] 01/14/23 0930

## 2023-01-15 LAB — URINE CULTURE: Culture: NO GROWTH

## 2023-01-23 ENCOUNTER — Encounter: Payer: Self-pay | Admitting: Internal Medicine

## 2023-01-23 ENCOUNTER — Other Ambulatory Visit: Payer: Self-pay

## 2023-01-23 ENCOUNTER — Encounter: Payer: Self-pay | Admitting: Cardiology

## 2023-01-23 ENCOUNTER — Ambulatory Visit: Payer: BLUE CROSS/BLUE SHIELD | Admitting: Internal Medicine

## 2023-01-23 VITALS — BP 98/59 | HR 53 | Temp 97.5°F | Ht 70.0 in | Wt 301.8 lb

## 2023-01-23 DIAGNOSIS — I129 Hypertensive chronic kidney disease with stage 1 through stage 4 chronic kidney disease, or unspecified chronic kidney disease: Secondary | ICD-10-CM

## 2023-01-23 DIAGNOSIS — Z7984 Long term (current) use of oral hypoglycemic drugs: Secondary | ICD-10-CM

## 2023-01-23 DIAGNOSIS — E1122 Type 2 diabetes mellitus with diabetic chronic kidney disease: Secondary | ICD-10-CM | POA: Diagnosis not present

## 2023-01-23 DIAGNOSIS — N189 Chronic kidney disease, unspecified: Secondary | ICD-10-CM | POA: Diagnosis not present

## 2023-01-23 DIAGNOSIS — I10 Essential (primary) hypertension: Secondary | ICD-10-CM

## 2023-01-23 MED ORDER — SEMAGLUTIDE(0.25 OR 0.5MG/DOS) 2 MG/3ML ~~LOC~~ SOPN: 0.2500 mg | PEN_INJECTOR | SUBCUTANEOUS | 3 refills | Status: AC

## 2023-01-23 NOTE — Assessment & Plan Note (Addendum)
The patient has a history of HFrEF and HTN. He is on amlodipine 5 mg, coreg 6.25 mg bid, spiro 25 mg daily, entresto 24-26 mg bid, farxiga 10 mg daily, and lasix PRN. Today, his BP was low at 102/49 and then upon recheck, lower at 98/45 and then 87/53. The patient denied any headaches, dizziness, lightheadedness, vision changes, chest pain, SOB, or any symptoms. He states that he feels well and feels like his normal self. The patient was given two small bottles of water and BP was rechecked a few minutes later and improved slightly to 98/59. Again, he was asymptomatic from this BP and feels well.  Plan: - Stop amlodipine - Continue GDMT as above - Continue to check BP at home intermittently - Follow up in ~ 2 months for HTN and DM

## 2023-01-23 NOTE — Progress Notes (Signed)
CC: 1 month follow up diabetes  HPI:  [name] is a 62 y.o. male living with a history stated below and presents today for a follow up of his diabetes. Please see problem based assessment and plan for additional details.  Past Medical History:  Diagnosis Date   Bilateral lower extremity edema    BPH associated with nocturia    CKD (chronic kidney disease), stage III (HCC)    Heart failure with reduced ejection fraction (HCC) 04/2021   cardiologist--- dr Servando Salina   History of acute respiratory failure 05/11/2021   admission in epic;  in setting HTN urgency bp 300s w/ ARF w/ hypercapnia (sts 70s) w/ heart failure AKI/ new dx DM2/  hepatocellular injury   Ischemic cardiomyopathy 04/2021   per echo 01/ 2023 ef 40-45%;  echo 05-15-2022 50-55%   Malignant neoplasm prostate (HCC) 01/2022   urologist-  dr eskridge/  radiation oncologist--- dr Kathrynn Running:  dx 10/ 2023, gleason 4+4   Obesity, Class III, BMI 40-49.9 (morbid obesity) (HCC)    OSA on CPAP    per pt uses nightly   Severe hypertension    followed by cardiology and pcp   Type 2 diabetes mellitus (HCC) 04/2021   followed by pcp   Wears glasses    Wears partial dentures    1990s    Current Outpatient Medications on File Prior to Visit  Medication Sig Dispense Refill   Accu-Chek FastClix Lancets MISC USE AS DIRECTED 102 each 1   atorvastatin (LIPITOR) 40 MG tablet Take 1 tablet (40 mg total) by mouth daily. (Patient taking differently: Take 40 mg by mouth daily.) 90 tablet 3   benzonatate (TESSALON) 100 MG capsule Take 1 capsule (100 mg total) by mouth every 8 (eight) hours as needed for cough. 21 capsule 0   blood glucose meter kit and supplies KIT Use up to one time daily as directed. 1 each 0   carvedilol (COREG) 6.25 MG tablet Take 1 tablet (6.25 mg total) by mouth 2 (two) times daily with a meal. 180 tablet 1   cetirizine (ZYRTEC) 10 MG tablet Take 10 mg by mouth daily.     dapagliflozin propanediol (FARXIGA) 10 MG TABS  tablet Take 1 tablet (10 mg total) by mouth daily before breakfast. 90 tablet 0   fluticasone (FLONASE) 50 MCG/ACT nasal spray Place 1 spray into both nostrils daily. (Patient taking differently: Place 1 spray into both nostrils as needed.) 16 g 0   furosemide (LASIX) 20 MG tablet Take 1 tablet (20 mg total) by mouth daily. (Patient taking differently: Take 20 mg by mouth 2 (two) times a week.) 90 tablet 1   gabapentin (NEURONTIN) 100 MG capsule Take 1 capsule (100 mg total) by mouth 2 (two) times daily. 180 capsule 0   glucose blood test strip Use up to 1 time daily. 30 each 11   metFORMIN (GLUCOPHAGE) 500 MG tablet Take 2 tablets (1,000 mg total) by mouth daily with breakfast. 180 tablet 1   Multiple Vitamins-Minerals (MENS MULTIVITAMIN PLUS PO) Take by mouth daily.     sacubitril-valsartan (ENTRESTO) 24-26 MG Take 1 tablet by mouth 2 (two) times daily. 60 tablet 0   sildenafil (VIAGRA) 50 MG tablet Take 1 tablet (50 mg total) by mouth as needed for erectile dysfunction. Take 1 hour before sexual activity. If you experience headache, reduce to half tablet. 10 tablet 0   sodium chloride (OCEAN) 0.65 % SOLN nasal spray Place 1 spray into both nostrils as needed  for congestion. (Patient not taking: Reported on 12/03/2022)     solifenacin (VESICARE) 5 MG tablet Take 1 tablet (5 mg total) by mouth daily. 60 tablet 1   spironolactone (ALDACTONE) 25 MG tablet Take 1 tablet (25 mg total) by mouth daily. 90 tablet 3   tamsulosin (FLOMAX) 0.4 MG CAPS capsule TAKE 1 CAPSULE BY MOUTH ONCE DAILY AFTER BREAKFAST 30 capsule 0   No current facility-administered medications on file prior to visit.    Family History  Problem Relation Age of Onset   Heart failure Father    Hypertension Father     Social History   Socioeconomic History   Marital status: Single    Spouse name: Not on file   Number of children: Not on file   Years of education: Not on file   Highest education level: Not on file   Occupational History   Not on file  Tobacco Use   Smoking status: Some Days    Types: Cigarettes   Smokeless tobacco: Never   Tobacco comments:    06-24-2022  per pt smokes 1-2 per week,  smoked since age 3  Vaping Use   Vaping status: Never Used  Substance and Sexual Activity   Alcohol use: Not Currently    Comment: very rare   Drug use: Not Currently    Comment: 06-24-2022 per pt many yrs ago   Sexual activity: Not on file  Other Topics Concern   Not on file  Social History Narrative   Not on file   Social Determinants of Health   Financial Resource Strain: Low Risk  (08/15/2022)   Overall Financial Resource Strain (CARDIA)    Difficulty of Paying Living Expenses: Not hard at all  Food Insecurity: No Food Insecurity (08/15/2022)   Hunger Vital Sign    Worried About Running Out of Food in the Last Year: Never true    Ran Out of Food in the Last Year: Never true  Transportation Needs: No Transportation Needs (08/15/2022)   PRAPARE - Administrator, Civil Service (Medical): No    Lack of Transportation (Non-Medical): No  Physical Activity: Inactive (08/15/2022)   Exercise Vital Sign    Days of Exercise per Week: 0 days    Minutes of Exercise per Session: 0 min  Stress: No Stress Concern Present (04/02/2022)   [employer] of Occupational Health - Occupational Stress Questionnaire    Feeling of Stress : Not at all  Social Connections: Moderately Isolated (08/15/2022)   Social Connection and Isolation Panel [NHANES]    Frequency of Communication with Friends and Family: More than three times a week    Frequency of Social Gatherings with Friends and Family: More than three times a week    Attends Religious Services: More than 4 times per year    Active Member of [employer] or Organizations: No    Attends Banker Meetings: Never    Marital Status: Widowed  Intimate Partner Violence: Not At Risk (08/15/2022)   Humiliation, Afraid, Rape, and Kick  questionnaire    Fear of Current or Ex-Partner: No    Emotionally Abused: No    Physically Abused: No    Sexually Abused: No    Review of Systems: ROS negative except for what is noted on the assessment and plan.  Vitals:   01/23/23 0919 01/23/23 0934 01/23/23 0935 01/23/23 0951  BP: (!) 102/49 (!) 98/45 (!) 87/53 (!) 98/59  Pulse: (!) 58 (!) 57 (!) 51 (!) 53  Temp: (!) 97.5 F (36.4 C)     TempSrc: Oral     SpO2: 98%     Weight: (!) 301 lb 12.8 oz (136.9 kg)     Height: 5\' 10"  (1.778 m)       Physical Exam: Constitutional: well-appearing , in no acute distress Cardiovascular: regular/slightly bradycardic rate and regular rhythm, no m/r/g Pulmonary/Chest: normal work of breathing on room air, lungs clear to auscultation bilaterally MSK: normal bulk and tone Neurological: alert & oriented x 3, no focal deficit Skin: warm and dry Psych: normal mood and behavior  Assessment & Plan:    Patient discussed with Dr. Criselda Peaches  Type 2 diabetes mellitus with kidney complication, without long-term current use of insulin (HCC) A1c increased slightly to 7.6% when checked one month ago. The patient is on metformin 1000 mg daily and farxiga 10 mg daily (recently increased from the 5 mg dose). He is doing well and is tolerating these medications without any issues. He presented back today to discuss adding a GLP1 agonist, as recommended by his PCP for his diabetes and to assist with weight loss.  Plan: - Continue metformin 1000 mg daily - Continue farxiga 10 mg daily - Start ozempic 0.25 mg/week (will start next month as the patient cannot afford it this month -- it is $30 for a two month supply) - Urine micro today - Deferred foot exam to next visit - Follow up in 2 months  Severe hypertension The patient has a history of HFrEF and HTN. He is on amlodipine 5 mg, coreg 6.25 mg bid, spiro 25 mg daily, entresto 24-26 mg bid, farxiga 10 mg daily, and lasix PRN. Today, his BP was low at  102/49 and then upon recheck, lower at 98/45 and then 87/53. The patient denied any headaches, dizziness, lightheadedness, vision changes, chest pain, SOB, or any symptoms. He states that he feels well and feels like his normal self. The patient was given two small bottles of water and BP was rechecked a few minutes later and improved slightly to 98/59. Again, he was asymptomatic from this BP and feels well.  Plan: - Stop amlodipine - Continue GDMT as above - Continue to check BP at home intermittently - Follow up in ~ 2 months for HTN and DM   Lavan Imes, D.O. [facility], PGY-3 Phone: [phone] Date 01/23/2023 Time 11:07 AM

## 2023-01-23 NOTE — Assessment & Plan Note (Addendum)
A1c increased slightly to 7.6% when checked one month ago. The patient is on metformin 1000 mg daily and farxiga 10 mg daily (recently increased from the 5 mg dose). He is doing well and is tolerating these medications without any issues. He presented back today to discuss adding a GLP1 agonist, as recommended by his PCP for his diabetes and to assist with weight loss.  Plan: - Continue metformin 1000 mg daily - Continue farxiga 10 mg daily - Start ozempic 0.25 mg/week (will start next month as the patient cannot afford it this month -- it is $30 for a two month supply) - Urine micro today - Deferred foot exam to next visit - Follow up in 2 months

## 2023-01-23 NOTE — Patient Instructions (Addendum)
Thank you, Mr.Broox Iwai for allowing [place] to provide your care today. Today we discussed:  Blood pressure/heart failure STOP taking amlodipine Continue to take carvedilol twice a day, entresto twice a day, spironolactone once a day, farxiga once a day, and lasix as needed Diabetes/weight loss I am sending in Ozempic - this is the once a week injection to help your diabetes and to help you lose weight If it is too expensive you do not need to pick it up and we can talk about other options at your next visit  I have ordered the following labs for you:   Lab Orders         Microalbumin / Creatinine Urine Ratio       Referrals ordered today:   Referral Orders  No referral(s) requested today     I have ordered the following medication/changed the following medications:   Stop the following medications: Medications Discontinued During This Encounter  Medication Reason   amLODipine (NORVASC) 5 MG tablet      Start the following medications: Meds ordered this encounter  Medications   Semaglutide,0.25 or 0.5MG /DOS, 2 MG/3ML SOPN    Sig: Inject 0.25 mg into the skin once a week.    Dispense:  3 mL    Refill:  3     Follow up: 2 months    Should you have any questions or concerns please call the internal medicine clinic at [phone].     Elza Rafter, D.O. [facility]

## 2023-01-24 LAB — MICROALBUMIN / CREATININE URINE RATIO
Creatinine, Urine: 132.7 mg/dL
Microalb/Creat Ratio: 14 mg/g{creat} (ref 0–29)
Microalbumin, Urine: 18.1 ug/mL

## 2023-01-24 NOTE — Progress Notes (Signed)
Normal micro/creatinine ratio.

## 2023-01-27 ENCOUNTER — Other Ambulatory Visit: Payer: Self-pay

## 2023-01-28 MED ORDER — METFORMIN HCL 500 MG PO TABS: 1000.0000 mg | ORAL_TABLET | Freq: Every day | ORAL | 1 refills | Status: DC

## 2023-02-04 NOTE — Progress Notes (Signed)
Internal Medicine Clinic Attending  Case discussed with the resident at the time of the visit.  We reviewed the resident's history and exam and pertinent patient test results.  I agree with the assessment, diagnosis, and plan of care documented in the resident's note.  Debe Coder, MD

## 2023-02-06 ENCOUNTER — Other Ambulatory Visit: Payer: Self-pay

## 2023-02-06 DIAGNOSIS — I5022 Chronic systolic (congestive) heart failure: Secondary | ICD-10-CM

## 2023-02-06 MED ORDER — ENTRESTO 24-26 MG PO TABS
1.0000 | ORAL_TABLET | Freq: Two times a day (BID) | ORAL | 0 refills | Status: DC
Start: 2023-02-06 — End: 2023-03-05

## 2023-02-17 ENCOUNTER — Ambulatory Visit: Payer: BLUE CROSS/BLUE SHIELD | Attending: Cardiology | Admitting: Cardiology

## 2023-03-04 ENCOUNTER — Other Ambulatory Visit: Payer: Self-pay | Admitting: Student

## 2023-03-04 ENCOUNTER — Ambulatory Visit: Payer: BLUE CROSS/BLUE SHIELD

## 2023-03-04 ENCOUNTER — Ambulatory Visit
Admission: EM | Admit: 2023-03-04 | Discharge: 2023-03-04 | Disposition: A | Discharge status: Home / Self Care | Payer: BLUE CROSS/BLUE SHIELD

## 2023-03-04 DIAGNOSIS — I5022 Chronic systolic (congestive) heart failure: Secondary | ICD-10-CM | POA: Diagnosis not present

## 2023-03-04 DIAGNOSIS — J019 Acute sinusitis, unspecified: Secondary | ICD-10-CM

## 2023-03-04 DIAGNOSIS — I130 Hypertensive heart and chronic kidney disease with heart failure and stage 1 through stage 4 chronic kidney disease, or unspecified chronic kidney disease: Secondary | ICD-10-CM | POA: Diagnosis not present

## 2023-03-04 DIAGNOSIS — R300 Dysuria: Secondary | ICD-10-CM

## 2023-03-04 DIAGNOSIS — J0191 Acute recurrent sinusitis, unspecified: Secondary | ICD-10-CM | POA: Diagnosis not present

## 2023-03-04 LAB — POCT URINALYSIS DIP (MANUAL ENTRY)
Bilirubin, UA: NEGATIVE
Blood, UA: NEGATIVE
Glucose, UA: NEGATIVE mg/dL
Ketones, POC UA: NEGATIVE mg/dL
Leukocytes, UA: NEGATIVE
Nitrite, UA: NEGATIVE
Protein Ur, POC: NEGATIVE mg/dL
Spec Grav, UA: 1.025 (ref 1.010–1.025)
Urobilinogen, UA: 0.2 U/dL
pH, UA: 6 (ref 5.0–8.0)

## 2023-03-04 MED ORDER — AMOXICILLIN-POT CLAVULANATE 875-125 MG PO TABS
1.0000 | ORAL_TABLET | Freq: Two times a day (BID) | ORAL | 0 refills | Status: AC
Start: 2023-03-04 — End: ?

## 2023-03-04 NOTE — ED Triage Notes (Signed)
"  This started with burning with urination for about 3 days now" "I also have a Cough that started some time ago". "I also have Sinus ha/cold that I have had for a while prior to these symptoms". No sob known. No fever known.

## 2023-03-06 ENCOUNTER — Other Ambulatory Visit: Payer: Self-pay

## 2023-03-06 DIAGNOSIS — C61 Malignant neoplasm of prostate: Secondary | ICD-10-CM

## 2023-03-06 LAB — URINE CULTURE: Culture: <10000 — AB

## 2023-03-06 MED ORDER — TAMSULOSIN HCL 0.4 MG PO CAPS: 0.4000 mg | ORAL_CAPSULE | Freq: Every day | ORAL | 0 refills | Status: AC

## 2023-03-06 MED ORDER — AMLODIPINE BESYLATE 5 MG PO TABS: 5.0000 mg | ORAL_TABLET | Freq: Every day | ORAL | 3 refills | Status: AC

## 2023-03-15 NOTE — ED Provider Notes (Signed)
[name]    CSN: [id number] Arrival date & time: 03/04/23  8657      History   Chief Complaint Chief Complaint  Patient presents with   UTI Symptoms   Cough    HPI Jason Mccarty is a 62 y.o. male.   Patient here today for evaluation of burning with urination started 3 days ago.  He denies any fever.  He does not report any abdominal pain or back pain.  He also reports cough that started a while ago.  He states he has had sinus headache and cold.  He denies any shortness of breath.  The history is provided by the patient.  Cough Associated symptoms: sore throat   Associated symptoms: no chills, no ear pain, no eye discharge, no fever and no shortness of breath     Past Medical History:  Diagnosis Date   Bilateral lower extremity edema    BPH associated with nocturia    CKD (chronic kidney disease), stage III (HCC)    Heart failure with reduced ejection fraction (HCC) 04/2021   cardiologist--- dr Servando Salina   History of acute respiratory failure 05/11/2021   admission in epic;  in setting HTN urgency bp 300s w/ ARF w/ hypercapnia (sts 70s) w/ heart failure AKI/ new dx DM2/  hepatocellular injury   Ischemic cardiomyopathy 04/2021   per echo 01/ 2023 ef 40-45%;  echo 05-15-2022 50-55%   Malignant neoplasm prostate (HCC) 01/2022   urologist-  dr eskridge/  radiation oncologist--- dr Kathrynn Running:  dx 10/ 2023, gleason 4+4   Obesity, Class III, BMI 40-49.9 (morbid obesity) (HCC)    OSA on CPAP    per pt uses nightly   Severe hypertension    followed by cardiology and pcp   Type 2 diabetes mellitus (HCC) 04/2021   followed by pcp   Wears glasses    Wears partial dentures    1990s    Patient Active Problem List   Diagnosis Date Noted   Abnormal thyroid uptake 04/03/2022   Malignant neoplasm of prostate (HCC) 02/22/2022   Urinary frequency 11/19/2021   Encounter for screening colonoscopy 07/24/2021   ED (erectile dysfunction) 06/19/2021   Tobacco use disorder  05/17/2021   Severe hypertension 05/11/2021   Chronic HFrEF (heart failure with reduced ejection fraction) (HCC) 05/11/2021   Obstructive sleep apnea 05/11/2021   CKD (chronic kidney disease), stage III (HCC) 05/11/2021   Type 2 diabetes mellitus with kidney complication, without long-term current use of insulin (HCC) 05/11/2021   Morbid obesity (HCC) 05/11/2021   Abnormal finding on EKG 12/22/2012   Recurrent umbilical hernia 11/30/2012   Personal history of noncompliance with medical treatment, presenting hazards to health 10/03/2008   Carpal tunnel syndrome 12/30/2007   Special screening for malignant neoplasm of prostate 05/18/2007   Chest pain 02/20/2005   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   Hyperglycemia 02/02/2004   Edema leg 02/01/2004   Obesity 02/01/2004   Sleep apnea 02/01/2004   Hypertension 02/01/2004   Tobacco abuse 02/01/2004    Past Surgical History:  Procedure Laterality Date   RADIOACTIVE SEED IMPLANT N/A 06/25/2022   Procedure: RADIOACTIVE SEED IMPLANT/BRACHYTHERAPY IMPLANT;  Surgeon: Jerilee Field, MD;  Location: [facility]  Service: Urology;  Laterality: N/A;   SPACE OAR INSTILLATION N/A 06/25/2022   Procedure: SPACE OAR INSTILLATION;  Surgeon: Jerilee Field, MD;  Location: [facility];  Service: Urology;  Laterality: N/A;   UMBILICAL HERNIA REPAIR     1990s  Home Medications    Prior to Admission medications   Medication Sig Start Date End Date Taking? Authorizing Provider  amoxicillin-clavulanate (AUGMENTIN) 875-125 MG tablet Take 1 tablet by mouth every 12 (twelve) hours. 03/04/23  Yes Tomi Bamberger, PA-C  Accu-Chek FastClix Lancets MISC USE AS DIRECTED 09/23/22   Briscoe Burns, MD  amLODipine (NORVASC) 5 MG tablet Take 1 tablet (5 mg total) by mouth daily. 03/06/23   Annett Fabian, MD  aspirin EC 81 MG tablet Take 81 mg by mouth daily. 05/18/07   [provider]  atorvastatin (LIPITOR)  40 MG tablet Take 1 tablet (40 mg total) by mouth daily. Patient taking differently: Take 40 mg by mouth daily. 06/14/22   Briscoe Burns, MD  benzonatate (TESSALON) 100 MG capsule Take 1 capsule (100 mg total) by mouth every 8 (eight) hours as needed for cough. 01/14/23   Gustavus Bryant, FNP  blood glucose meter kit and supplies KIT Use up to one time daily as directed. 06/26/21   Briscoe Burns, MD  carvedilol (COREG) 6.25 MG tablet Take 1 tablet (6.25 mg total) by mouth 2 (two) times daily with a meal. 09/23/22   Briscoe Burns, MD  cetirizine (ZYRTEC) 10 MG tablet Take 10 mg by mouth daily.    [provider]  dapagliflozin propanediol (FARXIGA) 10 MG TABS tablet Take 1 tablet (10 mg total) by mouth daily before breakfast. 12/25/22 03/25/23  Annett Fabian, MD  ENTRESTO 24-26 MG Take 1 tablet by mouth twice daily 03/05/23   Annett Fabian, MD  fluticasone [facility]) 50 MCG/ACT nasal spray Place 1 spray into both nostrils daily. Patient taking differently: Place 1 spray into both nostrils as needed. 09/23/22   Briscoe Burns, MD  furosemide (LASIX) 20 MG tablet Take 1 tablet (20 mg total) by mouth daily. Patient taking differently: Take 20 mg by mouth 2 (two) times a week. 09/23/22   Briscoe Burns, MD  gabapentin (NEURONTIN) 100 MG capsule Take 1 capsule (100 mg total) by mouth 2 (two) times daily. 12/24/22 03/24/23  Annett Fabian, MD  glucose blood test strip Use up to 1 time daily. 09/23/22   Briscoe Burns, MD  lisinopril-hydrochlorothiazide (ZESTORETIC) 20-25 MG tablet Take 1 tablet by mouth daily.    [provider]  metFORMIN (GLUCOPHAGE) 500 MG tablet Take 2 tablets (1,000 mg total) by mouth daily with breakfast. 01/28/23   Annett Fabian, MD  Multiple Vitamins-Minerals (MENS MULTIVITAMIN PLUS PO) Take by mouth daily.    [provider]  Semaglutide,0.25 or 0.5MG /DOS, 2 MG/3ML SOPN Inject 0.25 mg into the skin once a week. 01/23/23   Atway, Rayann N, DO   sildenafil (VIAGRA) 50 MG tablet Take 1 tablet (50 mg total) by mouth as needed for erectile dysfunction. Take 1 hour before sexual activity. If you experience headache, reduce to half tablet. 09/23/22 09/23/23  Briscoe Burns, MD  sodium chloride (OCEAN) 0.65 % SOLN nasal spray Place 1 spray into both nostrils as needed for congestion. Patient not taking: Reported on 12/03/2022    [provider]  solifenacin (VESICARE) 5 MG tablet Take 1 tablet (5 mg total) by mouth daily. 08/15/22   Briscoe Burns, MD  spironolactone (ALDACTONE) 25 MG tablet Take 1 tablet (25 mg total) by mouth daily. 06/14/22   Briscoe Burns, MD  tamsulosin (FLOMAX) 0.4 MG CAPS capsule Take 1 capsule (0.4 mg total) by mouth daily. 03/06/23 04/05/23  Annett Fabian, MD    Family  History Family History  Problem Relation Age of Onset   Heart failure Father    Hypertension Father     Social History Social History   Tobacco Use   Smoking status: Some Days    Types: Cigarettes   Smokeless tobacco: Never   Tobacco comments:    06-24-2022  per pt smokes 1-2 per week,  smoked since age 6  Vaping Use   Vaping status: Never Used  Substance Use Topics   Alcohol use: Not Currently   Drug use: Not Currently    Comment: 06-24-2022 per pt many yrs ago     Allergies   Benadryl [diphenhydramine]   Review of Systems Review of Systems  Constitutional:  Negative for chills and fever.  HENT:  Positive for congestion, sinus pressure and sore throat. Negative for ear pain.   Eyes:  Negative for discharge and redness.  Respiratory:  Positive for cough. Negative for shortness of breath.   Gastrointestinal:  Negative for abdominal pain, nausea and vomiting.  Genitourinary:  Positive for dysuria.     Physical Exam Triage Vital Signs ED Triage Vitals  Encounter Vitals Group     BP 03/04/23 0933 131/78     Systolic BP Percentile --      Diastolic BP Percentile --      Pulse Rate 03/04/23 0933 (!) 54      Resp 03/04/23 0933 20     Temp 03/04/23 0933 98.4 F (36.9 C)     Temp Source 03/04/23 0933 Oral     SpO2 03/04/23 0933 95 %     Weight 03/04/23 0932 (!) 302 lb (137 kg)     Height 03/04/23 0932 5\' 10"  (1.778 m)     Head Circumference --      Peak Flow --      Pain Score 03/04/23 0930 0     Pain Loc --      Pain Education --      Exclude from Growth Chart --    No data found.  Updated Vital Signs BP 131/78 (BP Location: Left Arm)   Pulse (!) 56   Temp 98.4 F (36.9 C) (Oral)   Resp 20   Ht 5\' 10"  (1.778 m)   Wt (!) 302 lb (137 kg)   SpO2 96%   BMI 43.33 kg/m   Visual Acuity Right Eye Distance:   Left Eye Distance:   Bilateral Distance:    Right Eye Near:   Left Eye Near:    Bilateral Near:     Physical Exam Vitals and nursing note reviewed.  Constitutional:      General: He is not in acute distress.    Appearance: Normal appearance. He is not ill-appearing.  HENT:     Head: Normocephalic and atraumatic.     Nose: Congestion present.     Mouth/Throat:     Mouth: Mucous membranes are moist.     Pharynx: Oropharynx is clear. No oropharyngeal exudate or posterior oropharyngeal erythema.  Eyes:     Conjunctiva/sclera: Conjunctivae normal.  Cardiovascular:     Rate and Rhythm: Normal rate and regular rhythm.     Heart sounds: Normal heart sounds. No murmur heard. Pulmonary:     Effort: Pulmonary effort is normal. No respiratory distress.     Breath sounds: Normal breath sounds. No wheezing, rhonchi or rales.  Skin:    General: Skin is warm and dry.  Neurological:     Mental Status: He is alert.  Psychiatric:  Mood and Affect: Mood normal.        Thought Content: Thought content normal.      UC Treatments / Results  Labs (all labs ordered are listed, but only abnormal results are displayed) Labs Reviewed  URINE CULTURE - Abnormal; Notable for the following components:      Result Value   Culture   (*)    Value: <10,000 COLONIES/mL INSIGNIFICANT  GROWTH Performed at [facility], [address] [place], [place] [address]    All other components within normal limits  POCT URINALYSIS DIP (MANUAL ENTRY) - Abnormal; Notable for the following components:   Color, UA light yellow (*)    Clarity, UA cloudy (*)    All other components within normal limits    EKG   Radiology No results found.  Procedures Procedures (including critical care time)  Medications Ordered in UC Medications - No data to display  Initial Impression / Assessment and Plan / UC Course  I have reviewed the triage vital signs and the nursing notes.  Pertinent labs & imaging results that were available during my care of the patient were reviewed by me and considered in my medical decision making (see chart for details).    UA without signs of UTI.  Discussed same with patient.  Will treat with Augmentin to cover sinusitis.  Urine culture ordered as well.  Recommended follow-up if no gradual improvement of any symptoms or sooner with any worsening.  Final Clinical Impressions(s) / UC Diagnoses   Final diagnoses:  Dysuria  Acute sinusitis, recurrence not specified, unspecified location   Discharge Instructions   None    ED Prescriptions     Medication Sig Dispense Auth. Provider   amoxicillin-clavulanate (AUGMENTIN) 875-125 MG tablet Take 1 tablet by mouth every 12 (twelve) hours. 14 tablet Tomi Bamberger, PA-C      PDMP not reviewed this encounter.   Tomi Bamberger, PA-C 03/15/23 1505

## 2023-03-25 ENCOUNTER — Encounter: Payer: Self-pay | Admitting: Student

## 2023-03-25 ENCOUNTER — Telehealth: Payer: Self-pay

## 2023-03-25 ENCOUNTER — Ambulatory Visit (INDEPENDENT_AMBULATORY_CARE_PROVIDER_SITE_OTHER): Payer: BLUE CROSS/BLUE SHIELD | Admitting: Student

## 2023-03-25 VITALS — BP 127/67 | HR 60 | Temp 97.8°F | Ht 70.0 in | Wt 304.6 lb

## 2023-03-25 DIAGNOSIS — I5022 Chronic systolic (congestive) heart failure: Secondary | ICD-10-CM

## 2023-03-25 DIAGNOSIS — Z7984 Long term (current) use of oral hypoglycemic drugs: Secondary | ICD-10-CM | POA: Diagnosis not present

## 2023-03-25 DIAGNOSIS — I10 Essential (primary) hypertension: Secondary | ICD-10-CM | POA: Diagnosis not present

## 2023-03-25 DIAGNOSIS — E119 Type 2 diabetes mellitus without complications: Secondary | ICD-10-CM

## 2023-03-25 DIAGNOSIS — M545 Low back pain, unspecified: Secondary | ICD-10-CM | POA: Diagnosis not present

## 2023-03-25 DIAGNOSIS — Z7985 Long-term (current) use of injectable non-insulin antidiabetic drugs: Secondary | ICD-10-CM | POA: Diagnosis not present

## 2023-03-25 DIAGNOSIS — I110 Hypertensive heart disease with heart failure: Secondary | ICD-10-CM | POA: Diagnosis not present

## 2023-03-25 DIAGNOSIS — E1122 Type 2 diabetes mellitus with diabetic chronic kidney disease: Secondary | ICD-10-CM

## 2023-03-25 LAB — GLUCOSE, CAPILLARY: Glucose-Capillary: 147 mg/dL — ABNORMAL HIGH (ref 70–99)

## 2023-03-25 LAB — POCT GLYCOSYLATED HEMOGLOBIN (HGB A1C): Hemoglobin A1C: 7.7 % — AB (ref 4.0–5.6)

## 2023-03-25 MED ORDER — LIDOCAINE 5 % EX PTCH
1.0000 | MEDICATED_PATCH | Freq: Two times a day (BID) | CUTANEOUS | 1 refills | Status: AC
Start: 2023-03-25 — End: 2023-03-30

## 2023-03-25 MED ORDER — DICLOFENAC SODIUM 1 % EX GEL: 4.0000 g | Freq: Four times a day (QID) | CUTANEOUS | 0 refills | Status: AC

## 2023-03-25 NOTE — Progress Notes (Signed)
CC: diabetes follow up  HPI:  Jason Mccarty is a 62 y.o. male living with a history stated below and presents today for diabetes follow up. Please see problem based assessment and plan for additional details.  Past Medical History:  Diagnosis Date   Bilateral lower extremity edema    BPH associated with nocturia    CKD (chronic kidney disease), stage III (HCC)    Heart failure with reduced ejection fraction (HCC) 04/2021   cardiologist--- dr Servando Salina   History of acute respiratory failure 05/11/2021   admission in epic;  in setting HTN urgency bp 300s w/ ARF w/ hypercapnia (sts 70s) w/ heart failure AKI/ new dx DM2/  hepatocellular injury   Ischemic cardiomyopathy 04/2021   per echo 01/ 2023 ef 40-45%;  echo 05-15-2022 50-55%   Malignant neoplasm prostate (HCC) 01/2022   urologist-  dr eskridge/  radiation oncologist--- dr Kathrynn Running:  dx 10/ 2023, gleason 4+4   Obesity, Class III, BMI 40-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 90 tablet 3   amoxicillin-clavulanate (AUGMENTIN) 875-125 MG tablet Take 1 tablet by mouth every 12 (twelve) hours. 14 tablet 0   aspirin EC 81 MG tablet Take 81 mg by mouth daily.     atorvastatin (LIPITOR) 40 MG tablet Take 1 tablet (40 mg total) by mouth daily. (Patient taking differently: Take 40 mg by mouth daily.) 90 tablet 3   benzonatate (TESSALON) 100 MG capsule Take 1 capsule (100 mg total) by mouth every 8 (eight) hours as needed for cough. 21 capsule 0   blood glucose meter kit and supplies KIT Use up to one time daily as directed. 1  each 0   carvedilol (COREG) 6.25 MG tablet Take 1 tablet (6.25 mg total) by mouth 2 (two) times daily with a meal. 180 tablet 1   cetirizine (ZYRTEC) 10 MG tablet Take 10 mg by mouth daily.     dapagliflozin propanediol (FARXIGA) 10 MG TABS tablet Take 1 tablet (10 mg total) by mouth daily before breakfast. 90 tablet 0   ENTRESTO 24-26 MG Take 1 tablet by mouth twice daily 60 tablet 0   fluticasone (FLONASE) 50 MCG/ACT nasal spray Place 1 spray into both nostrils daily. (Patient taking differently: Place 1 spray into both nostrils as needed.) 16 g 0   furosemide (LASIX) 20 MG tablet Take 1 tablet (20 mg total) by mouth daily. (Patient taking differently: Take 20 mg by mouth 2 (two) times a week.) 90 tablet 1   gabapentin (NEURONTIN) 100 MG capsule Take 1 capsule (100 mg total) by mouth 2 (two) times daily. 180 capsule 0   glucose blood test strip Use up to 1 time daily. 30 each 11   lisinopril-hydrochlorothiazide (ZESTORETIC) 20-25 MG tablet Take 1 tablet by mouth daily.     Multiple Vitamins-Minerals (MENS MULTIVITAMIN PLUS PO) Take by mouth daily.     Semaglutide,0.25 or 0.5MG /DOS, 2 MG/3ML SOPN Inject 0.25 mg into the skin once a week.  3 mL 3   sildenafil (VIAGRA) 50 MG tablet Take 1 tablet (50 mg total) by mouth as needed for erectile dysfunction. Take 1 hour before sexual activity. If you experience headache, reduce to half tablet. 10 tablet 0   sodium chloride (OCEAN) 0.65 % SOLN nasal spray Place 1 spray into both nostrils as needed for congestion. (Patient not taking: Reported on 12/03/2022)     solifenacin (VESICARE) 5 MG tablet Take 1 tablet (5 mg total) by mouth daily. 60 tablet 1   spironolactone (ALDACTONE) 25 MG tablet Take 1 tablet (25 mg total) by mouth daily. 90 tablet 3   tamsulosin (FLOMAX) 0.4 MG CAPS capsule Take 1 capsule (0.4 mg total) by mouth daily. 30 capsule 0   No current facility-administered medications on file prior to visit.   Family History  Problem Relation Age  of Onset   Heart failure Father    Hypertension Father    Social History   Socioeconomic History   Marital status: Single    Spouse name: Not on file   Number of children: Not on file   Years of education: Not on file   Highest education level: Not on file  Occupational History   Not on file  Tobacco Use   Smoking status: Some Days    Types: Cigarettes   Smokeless tobacco: Never   Tobacco comments:    06-24-2022  per pt smokes 1-2 per week,  smoked since age 31  Vaping Use   Vaping status: Never Used  Substance and Sexual Activity   Alcohol use: Not Currently   Drug use: Not Currently    Comment: 06-24-2022 per pt many yrs ago   Sexual activity: Not Currently  Other Topics Concern   Not on file  Social History Narrative   Not on file   Social Determinants of Health   Financial Resource Strain: Low Risk  (08/15/2022)   Overall Financial Resource Strain (CARDIA)    Difficulty of Paying Living Expenses: Not hard at all  Food Insecurity: No Food Insecurity (08/15/2022)   Hunger Vital Sign    Worried About Running Out of Food in the Last Year: Never true    Ran Out of Food in the Last Year: Never true  Transportation Needs: No Transportation Needs (08/15/2022)   PRAPARE - Administrator, Civil Service (Medical): No    Lack of Transportation (Non-Medical): No  Physical Activity: Inactive (08/15/2022)   Exercise Vital Sign    Days of Exercise per Week: 0 days    Minutes of Exercise per Session: 0 min  Stress: No Stress Concern Present (04/02/2022)   [employer] of Occupational Health - Occupational Stress Questionnaire    Feeling of Stress : Not at all  Social Connections: Moderately Isolated (08/15/2022)   Social Connection and Isolation Panel [NHANES]    Frequency of Communication with Friends and Family: More than three times a week    Frequency of Social Gatherings with Friends and Family: More than three times a week    Attends Religious Services: More  than 4 times per year    Active Member of [employer] or Organizations: No    Attends Banker Meetings: Never    Marital Status: Widowed  Intimate Partner Violence: Not At Risk (08/15/2022)   Humiliation, Afraid, Rape, and Kick questionnaire    Fear of Current or Ex-Partner: No    Emotionally Abused: No    Physically Abused: No    Sexually Abused: No  Review of Systems: ROS negative except for what is noted on the assessment and plan.  Vitals:   03/25/23 0902  BP: 127/67  Pulse: 60  Temp: 97.8 F (36.6 C)  TempSrc: Oral  SpO2: 99%  Weight: (!) 304 lb 9.6 oz (138.2 kg)  Height: 5\' 10"  (1.778 m)   Physical Exam: Constitutional: well appearing, in no acute distress,  Cardiovascular: regular rate and rhythm, no m/r/g; 1+ LE pitting edema bilaterally Pulmonary/Chest: CTA bilaterally, normal respiratory effort; no wheezes, crackles MSK: lumbar pain with flexion of the spine; no swelling or erythema of joints; no focal ttp Neurological: alert & oriented x 3, sensory loss on great toe of right foot; transient numbness/tingling of third digit of left foot; no other focal deficits  Assessment & Plan:   Patient discussed with Dr. Antony Contras  Severe hypertension Patient has a history of hypertension. Blood pressure today is well-controlled at BP: 127/67. He is currently taking coreg 6.25 mg bid, spiro 25 mg daily, entresto 24-26 mg bid, farxiga 10 mg daily, and lasix PRN with no concerns. Amlodipine was stopped at his last visit. He denies any ongoing symptoms associated with high blood pressure including headaches, dizziness/lightheadedness, chest pain, shortness of breath, or vision changes.   Plan - Continue Coreg 6.25 mg twice daily, spironolactone 25 mg daily, Entresto 24-26 mg twice daily, Farxiga 10 mg daily, and Lasix as needed - BMP today to evaluate kidney function and electrolytes  Type 2 diabetes mellitus with kidney complication, without long-term current use of  insulin (HCC) Hemoglobin A1c today is 7.7%, stable from previous A1c of 7.6% three months ago.  He is currently taking Farxiga 10 mg daily.  He stopped metformin because he felt like it was worsening his back pain.  At the last visit, it was recommended he begin Ozempic, however he deferred due to cost.  On further questioning, he was concerned about the needle as he has severe needle phobia.  I spoke with him about why feel like this medication will benefit him given his heart failure, poorly controlled diabetes, and obesity.  He would now like to try this medication.  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  He also notes some polyuria, but this is likely multifactorial in the setting of Flomax, Farxiga, Lasix and high blood sugars. He had a urine microalbumin in October, which was normal. He is due for a diabetic eye exam in May 2025.   I spoke extensively about my concerns of his disease progression and the importance of getting his sugars under control, whether through lifestyle interventions or medication changes.   Plan - Continue Farxiga 10 mg daily - Encouraged patient to start Ozempic 0.25 mg weekly - Follow-up in 3 months, unless he starts Ozempic, then we can follow-up in 4 weeks for dose increase  Chronic HFrEF (heart failure with reduced ejection fraction) (HCC) He has known HFrEF with an EF of 50-55% on GDMT.  He is currently taking carvedilol 6.25 mg twice daily, Farxiga 10 mg daily, spironolactone 25 mg daily, and Entresto 24-26 twice daily.  He is relatively euvolemic on exam today.  He has 1+ pitting edema, lungs are CTA bilaterally. He reports occasional shortness of breath.  He has not taken his Lasix in a while, so I encouraged him to go ahead and take a dose of it when he notices swelling or orthopnea.   Plan - Continue carvedilol 6.25 mg twice daily,  Farxiga 10 mg daily, spironolactone 25 mg daily, Entresto 24-26 mg twice  daily, and Lasix as needed - BMP today - Follow-up appointment with cardiology in February  Lumbar pain He reports chronic lumbar pain, which he says he has been dealing with for a while but worsened recently.  He felt it was due to his metformin, so he stopped it.  He also reports some sciatic pain radiating down his right leg occasionally.  He noticed the pain worsens when he walks a lot or bends over frequently.  He has been taking extra strength Tylenol for the pain, as well as gabapentin 100 mg twice daily.  He does not have any red flag symptoms at this time.  His symptoms sound most likely related to degenerative disc disease, however given his history of prostate cancer and lack of recent imaging, I feel imaging would be clinically appropriate.  Plan - Continue gabapentin 100 mg twice daily, extreme Tylenol as needed - Prescribed lidocaine patches, Voltaren gel for both the back and knees - Lumbar x-ray to screen for blastic lesions vs DDD  Annett Fabian, MD  [facility] Internal Medicine, PGY-1 Phone: [phone] Date 03/25/2023 Time 1:55 PM

## 2023-03-25 NOTE — Assessment & Plan Note (Addendum)
He reports chronic lumbar pain, which he says he has been dealing with for a while but worsened recently.  He felt it was due to his metformin, so he stopped it.  He also reports some sciatic pain radiating down his right leg occasionally.  He noticed the pain worsens when he walks a lot or bends over frequently.  He has been taking extra strength Tylenol for the pain, as well as gabapentin 100 mg twice daily.  He does not have any red flag symptoms at this time.  His symptoms sound most likely related to degenerative disc disease, however given his history of prostate cancer and lack of recent imaging, I feel imaging would be clinically appropriate.  Plan - Continue gabapentin 100 mg twice daily, extreme Tylenol as needed - Prescribed lidocaine patches, Voltaren gel for both the back and knees - Lumbar x-ray to screen for blastic lesions vs DDD

## 2023-03-25 NOTE — Assessment & Plan Note (Addendum)
Hemoglobin A1c today is 7.7%, stable from previous A1c of 7.6% three months ago.  He is currently taking Farxiga 10 mg daily.  He stopped metformin because he felt like it was worsening his back pain.  At the last visit, it was recommended he begin Ozempic, however he deferred due to cost.  On further questioning, he was concerned about the needle as he has severe needle phobia.  I spoke with him about why feel like this medication will benefit him given his heart failure, poorly controlled diabetes, and obesity.  He would now like to try this medication.  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  He also notes some polyuria, but this is likely multifactorial in the setting of Flomax, Farxiga, Lasix and high blood sugars. He had a urine microalbumin in October, which was normal. He is due for a diabetic eye exam in May 2025.   I spoke extensively about my concerns of his disease progression and the importance of getting his sugars under control, whether through lifestyle interventions or medication changes.   Plan - Continue Farxiga 10 mg daily - Encouraged patient to start Ozempic 0.25 mg weekly - Follow-up in 3 months, unless he starts Ozempic, then we can follow-up in 4 weeks for dose increase

## 2023-03-25 NOTE — Patient Instructions (Signed)
Thank you, Mr.Koron Kimura for allowing [place] to provide your care today. Today we discussed your diabetes, blood pressure, and back pain.  For your diabetes, we are checking a hemoglobin A1c today.  We will also check your electrolytes and kidney function.  Please continue taking [place].  I will send in a prescription for Ozempic to the pharmacy, which you can take once per week.  If the medication is too expensive, please give our clinic a call and I can try different medication.  Your blood pressure was well-controlled today.  Will not make any changes to your blood pressure medications.  For your heart failure, please keep taking your medications as prescribed.  You have a follow-up appointment with your cardiologist in February, which is important for you to make.  For your back pain, he can continue using gabapentin.  I have prescribed Voltaren gel and lidocaine patches, which you can use as needed per the package instructions  I have ordered the following labs for you:  Lab Orders         BMP8+Anion Gap         Glucose, capillary         POC Hbg A1C       Follow up: 3 months   We look forward to seeing you next time. Please call our clinic at [phone] if you have any questions or concerns. The best time to call is Monday-Friday from 9am-4pm, but there is someone available 24/7. If after hours or the weekend, call the main hospital number and ask for the Internal Medicine Resident On-Call. If you need medication refills, please notify your pharmacy one week in advance and they will send [place] a request.   Thank you for trusting me with your care. Wishing you the best!   Annett Fabian, MD [facility]

## 2023-03-25 NOTE — Telephone Encounter (Signed)
Prior Authorization for patient (Lidocaine 5% patches) came through on cover my meds was submitted with last office notes awaiting approval or denial.  QVZ:[id number]

## 2023-03-25 NOTE — Telephone Encounter (Signed)
Decision:Approved Jason Mccarty (Key: [id number]) PA Case ID #: [id number] Rx #: [id number] Need Help? Call us at [phone] Outcome Approved today by [facility] [employer] [facility] 2017 Approved. Authorization Expiration Date: 03/24/2024 Drug Lidocaine 5% patches ePA cloud logo Form [employer] Electronic Request Form Original Claim Info 75

## 2023-03-25 NOTE — Assessment & Plan Note (Addendum)
Patient has a history of hypertension. Blood pressure today is well-controlled at BP: 127/67. He is currently taking coreg 6.25 mg bid, spiro 25 mg daily, entresto 24-26 mg bid, farxiga 10 mg daily, and lasix PRN with no concerns. Amlodipine was stopped at his last visit. He denies any ongoing symptoms associated with high blood pressure including headaches, dizziness/lightheadedness, chest pain, shortness of breath, or vision changes.   Plan - Continue Coreg 6.25 mg twice daily, spironolactone 25 mg daily, Entresto 24-26 mg twice daily, Farxiga 10 mg daily, and Lasix as needed - BMP today to evaluate kidney function and electrolytes

## 2023-03-25 NOTE — Assessment & Plan Note (Addendum)
He has known HFrEF with an EF of 50-55% on GDMT.  He is currently taking carvedilol 6.25 mg twice daily, Farxiga 10 mg daily, spironolactone 25 mg daily, and Entresto 24-26 twice daily.  He is relatively euvolemic on exam today.  He has 1+ pitting edema, lungs are CTA bilaterally. He reports occasional shortness of breath.  He has not taken his Lasix in a while, so I encouraged him to go ahead and take a dose of it when he notices swelling or orthopnea.   Plan - Continue carvedilol 6.25 mg twice daily, Farxiga 10 mg daily, spironolactone 25 mg daily, Entresto 24-26 mg twice daily, and Lasix as needed - BMP today - Follow-up appointment with cardiology in February

## 2023-03-26 LAB — BMP8+ANION GAP
Anion Gap: 13 mmol/L (ref 10.0–18.0)
BUN/Creatinine Ratio: 15 (ref 10–24)
BUN: 19 mg/dL (ref 8–27)
CO2: 23 mmol/L (ref 20–29)
Calcium: 8.4 mg/dL — ABNORMAL LOW (ref 8.6–10.2)
Chloride: 106 mmol/L (ref 96–106)
Creatinine, Ser: 1.26 mg/dL (ref 0.76–1.27)
Glucose: 153 mg/dL — ABNORMAL HIGH (ref 70–99)
Potassium: 4.1 mmol/L (ref 3.5–5.2)
Sodium: 142 mmol/L (ref 134–144)
eGFR: 64 mL/min/{1.73_m2} (ref >59)

## 2023-04-01 NOTE — Progress Notes (Deleted)
.  attes

## 2023-04-01 NOTE — Progress Notes (Signed)
Internal Medicine Clinic Attending  Case discussed with the resident at the time of the visit.  We reviewed the resident's history and exam and pertinent patient test results.  I agree with the assessment, diagnosis, and plan of care documented in the resident's note.  

## 2023-05-20 NOTE — Progress Notes (Signed)
 Appt 05/22/23   Hi Dr. Sheena and Jasmine,  Sasan has been identified as a patient that could benefit from health coaching for smoking cessation. Discuss with patient their interest in participating in the free health coaching program and refer to REF 2201/Care Navigation.   Greig Ruth [phone] [email or web address] Care GuideHealth Coach

## 2023-05-22 ENCOUNTER — Ambulatory Visit: Payer: BLUE CROSS/BLUE SHIELD | Admitting: Cardiology

## 2023-06-17 ENCOUNTER — Other Ambulatory Visit: Payer: Self-pay

## 2023-06-17 DIAGNOSIS — I5022 Chronic systolic (congestive) heart failure: Secondary | ICD-10-CM

## 2023-06-17 DIAGNOSIS — E1122 Type 2 diabetes mellitus with diabetic chronic kidney disease: Secondary | ICD-10-CM

## 2023-06-17 DIAGNOSIS — I10 Essential (primary) hypertension: Secondary | ICD-10-CM

## 2023-06-17 MED ORDER — SPIRONOLACTONE 25 MG PO TABS: 25.0000 mg | ORAL_TABLET | Freq: Every day | ORAL | 3 refills | Status: AC

## 2023-06-17 MED ORDER — ATORVASTATIN CALCIUM 40 MG PO TABS: 40.0000 mg | ORAL_TABLET | Freq: Every day | ORAL | 11 refills | Status: AC

## 2023-06-19 ENCOUNTER — Encounter: Payer: BLUE CROSS/BLUE SHIELD | Admitting: Student

## 2023-08-01 ENCOUNTER — Ambulatory Visit: Payer: BLUE CROSS/BLUE SHIELD | Attending: Cardiology | Admitting: Cardiology

## 2023-09-15 ENCOUNTER — Encounter: Admitting: Student
